# Patient Record
Sex: Female | Born: 1980 | Race: White | Hispanic: No | Marital: Married | Smoking: Never smoker
Health system: Southern US, Community
[De-identification: ages and names within clinical notes are randomized; demographics above are authoritative.]

## PROBLEM LIST (undated history)

## (undated) DIAGNOSIS — G9340 Encephalopathy, unspecified: Secondary | ICD-10-CM

## (undated) DIAGNOSIS — S15009A Unspecified injury of unspecified carotid artery, initial encounter: Secondary | ICD-10-CM

## (undated) DIAGNOSIS — D649 Anemia, unspecified: Secondary | ICD-10-CM

## (undated) DIAGNOSIS — Z8489 Family history of other specified conditions: Secondary | ICD-10-CM

## (undated) DIAGNOSIS — Z8619 Personal history of other infectious and parasitic diseases: Secondary | ICD-10-CM

## (undated) DIAGNOSIS — R87629 Unspecified abnormal cytological findings in specimens from vagina: Secondary | ICD-10-CM

## (undated) DIAGNOSIS — F419 Anxiety disorder, unspecified: Secondary | ICD-10-CM

## (undated) DIAGNOSIS — Z789 Other specified health status: Secondary | ICD-10-CM

## (undated) DIAGNOSIS — S42009A Fracture of unspecified part of unspecified clavicle, initial encounter for closed fracture: Secondary | ICD-10-CM

## (undated) DIAGNOSIS — R569 Unspecified convulsions: Secondary | ICD-10-CM

## (undated) HISTORY — PX: WISDOM TOOTH EXTRACTION: SHX21

## (undated) HISTORY — DX: Personal history of other infectious and parasitic diseases: Z86.19

## (undated) HISTORY — DX: Unspecified abnormal cytological findings in specimens from vagina: R87.629

## (undated) HISTORY — PX: COLPOSCOPY: SHX161

## (undated) HISTORY — PX: CHEST TUBE INSERTION: SHX231

---

## 2002-09-20 ENCOUNTER — Other Ambulatory Visit: Admission: RE | Admit: 2002-09-20 | Discharge: 2002-09-20 | Payer: Self-pay | Admitting: Obstetrics and Gynecology

## 2004-09-11 ENCOUNTER — Other Ambulatory Visit: Admission: RE | Admit: 2004-09-11 | Discharge: 2004-09-11 | Payer: Self-pay | Admitting: Obstetrics and Gynecology

## 2005-06-10 ENCOUNTER — Other Ambulatory Visit: Admission: RE | Admit: 2005-06-10 | Discharge: 2005-06-10 | Payer: Self-pay | Admitting: Obstetrics and Gynecology

## 2006-01-16 ENCOUNTER — Other Ambulatory Visit: Admission: RE | Admit: 2006-01-16 | Discharge: 2006-01-16 | Payer: Self-pay | Admitting: Obstetrics and Gynecology

## 2014-01-20 LAB — OB RESULTS CONSOLE ABO/RH: RH Type: POSITIVE

## 2014-01-20 LAB — OB RESULTS CONSOLE HEPATITIS B SURFACE ANTIGEN: Hepatitis B Surface Ag: NEGATIVE

## 2014-01-20 LAB — OB RESULTS CONSOLE GC/CHLAMYDIA
Chlamydia: NEGATIVE
GC PROBE AMP, GENITAL: NEGATIVE

## 2014-01-20 LAB — OB RESULTS CONSOLE RPR: RPR: NONREACTIVE

## 2014-01-20 LAB — OB RESULTS CONSOLE ANTIBODY SCREEN: ANTIBODY SCREEN: NEGATIVE

## 2014-01-20 LAB — OB RESULTS CONSOLE HIV ANTIBODY (ROUTINE TESTING): HIV: NONREACTIVE

## 2014-01-20 LAB — OB RESULTS CONSOLE RUBELLA ANTIBODY, IGM: Rubella: IMMUNE

## 2014-01-21 ENCOUNTER — Inpatient Hospital Stay (HOSPITAL_COMMUNITY): Admission: AD | Admit: 2014-01-21 | Payer: Self-pay | Source: Ambulatory Visit | Admitting: Obstetrics and Gynecology

## 2014-08-09 ENCOUNTER — Encounter (HOSPITAL_COMMUNITY): Payer: Self-pay | Admitting: *Deleted

## 2014-08-09 ENCOUNTER — Telehealth (HOSPITAL_COMMUNITY): Payer: Self-pay | Admitting: *Deleted

## 2014-08-09 NOTE — Telephone Encounter (Signed)
Preadmission screen  

## 2014-08-10 ENCOUNTER — Telehealth (HOSPITAL_COMMUNITY): Payer: Self-pay | Admitting: *Deleted

## 2014-08-10 ENCOUNTER — Other Ambulatory Visit (HOSPITAL_COMMUNITY): Payer: Self-pay | Admitting: Obstetrics and Gynecology

## 2014-08-10 LAB — OB RESULTS CONSOLE GBS: GBS: NEGATIVE

## 2014-08-10 NOTE — Telephone Encounter (Signed)
Preadmission screen  

## 2014-08-11 ENCOUNTER — Inpatient Hospital Stay (HOSPITAL_COMMUNITY): Payer: PRIVATE HEALTH INSURANCE | Admitting: Anesthesiology

## 2014-08-11 ENCOUNTER — Encounter (HOSPITAL_COMMUNITY): Payer: Self-pay

## 2014-08-11 ENCOUNTER — Inpatient Hospital Stay (HOSPITAL_COMMUNITY)
Admission: RE | Admit: 2014-08-11 | Discharge: 2014-08-13 | DRG: 775 | Disposition: A | Discharge status: Home / Self Care | Payer: PRIVATE HEALTH INSURANCE | Source: Ambulatory Visit | Attending: Obstetrics and Gynecology | Admitting: Obstetrics and Gynecology

## 2014-08-11 DIAGNOSIS — Z3490 Encounter for supervision of normal pregnancy, unspecified, unspecified trimester: Secondary | ICD-10-CM

## 2014-08-11 DIAGNOSIS — Z3A38 38 weeks gestation of pregnancy: Secondary | ICD-10-CM | POA: Diagnosis present

## 2014-08-11 DIAGNOSIS — O365930 Maternal care for other known or suspected poor fetal growth, third trimester, not applicable or unspecified: Principal | ICD-10-CM | POA: Diagnosis present

## 2014-08-11 HISTORY — DX: Other specified health status: Z78.9

## 2014-08-11 LAB — TYPE AND SCREEN
ABO/RH(D): A POS
Antibody Screen: NEGATIVE

## 2014-08-11 LAB — CBC
HEMATOCRIT: 35 % — AB (ref 36.0–46.0)
HEMOGLOBIN: 11.9 g/dL — AB (ref 12.0–15.0)
MCH: 33.5 pg (ref 26.0–34.0)
MCHC: 34 g/dL (ref 30.0–36.0)
MCV: 98.6 fL (ref 78.0–100.0)
Platelets: 223 10*3/uL (ref 150–400)
RBC: 3.55 MIL/uL — AB (ref 3.87–5.11)
RDW: 12.8 % (ref 11.5–15.5)
WBC: 9.7 10*3/uL (ref 4.0–10.5)

## 2014-08-11 LAB — RPR

## 2014-08-11 LAB — ABO/RH: ABO/RH(D): A POS

## 2014-08-11 MED ORDER — DIPHENHYDRAMINE HCL 25 MG PO CAPS: 25.0000 mg | ORAL_CAPSULE | Freq: Four times a day (QID) | ORAL | Status: DC | PRN

## 2014-08-11 MED ORDER — PHENYLEPHRINE 40 MCG/ML (10ML) SYRINGE FOR IV PUSH (FOR BLOOD PRESSURE SUPPORT)
80.0000 ug | PREFILLED_SYRINGE | INTRAVENOUS | Status: DC | PRN
  Filled 2014-08-11: qty 2

## 2014-08-11 MED ORDER — DIPHENHYDRAMINE HCL 50 MG/ML IJ SOLN: 12.5000 mg | INTRAMUSCULAR | Status: DC | PRN

## 2014-08-11 MED ORDER — WITCH HAZEL-GLYCERIN EX PADS: 1.0000 "application " | MEDICATED_PAD | CUTANEOUS | Status: DC | PRN

## 2014-08-11 MED ORDER — OXYCODONE-ACETAMINOPHEN 5-325 MG PO TABS: 2.0000 | ORAL_TABLET | ORAL | Status: DC | PRN

## 2014-08-11 MED ORDER — TETANUS-DIPHTH-ACELL PERTUSSIS 5-2.5-18.5 LF-MCG/0.5 IM SUSP: 0.5000 mL | Freq: Once | INTRAMUSCULAR | Status: DC

## 2014-08-11 MED ORDER — ONDANSETRON HCL 4 MG/2ML IJ SOLN: 4.0000 mg | INTRAMUSCULAR | Status: DC | PRN

## 2014-08-11 MED ORDER — OXYTOCIN 40 UNITS IN LACTATED RINGERS INFUSION - SIMPLE MED
62.5000 mL/h | INTRAVENOUS | Status: DC
Start: 2014-08-11 — End: 2014-08-13
  Filled 2014-08-11: qty 1000

## 2014-08-11 MED ORDER — IBUPROFEN 600 MG PO TABS
600.0000 mg | ORAL_TABLET | Freq: Four times a day (QID) | ORAL | Status: DC
  Administered 2014-08-11 – 2014-08-13 (×8): 600 mg via ORAL
  Filled 2014-08-11 (×7): qty 1

## 2014-08-11 MED ORDER — LIDOCAINE HCL (PF) 1 % IJ SOLN
INTRAMUSCULAR | Status: DC | PRN
  Administered 2014-08-11 (×4): 4 mL

## 2014-08-11 MED ORDER — FENTANYL 2.5 MCG/ML BUPIVACAINE 1/10 % EPIDURAL INFUSION (WH - ANES)
14.0000 mL/h | INTRAMUSCULAR | Status: DC | PRN
  Administered 2014-08-11: 14 mL/h via EPIDURAL
  Filled 2014-08-11: qty 125

## 2014-08-11 MED ORDER — PHENYLEPHRINE 40 MCG/ML (10ML) SYRINGE FOR IV PUSH (FOR BLOOD PRESSURE SUPPORT)
80.0000 ug | PREFILLED_SYRINGE | INTRAVENOUS | Status: DC | PRN
  Filled 2014-08-11: qty 10
  Filled 2014-08-11: qty 2

## 2014-08-11 MED ORDER — EPHEDRINE 5 MG/ML INJ
10.0000 mg | INTRAVENOUS | Status: DC | PRN
  Filled 2014-08-11: qty 2

## 2014-08-11 MED ORDER — BENZOCAINE-MENTHOL 20-0.5 % EX AERO: 1.0000 "application " | INHALATION_SPRAY | CUTANEOUS | Status: DC | PRN

## 2014-08-11 MED ORDER — FENTANYL 2.5 MCG/ML BUPIVACAINE 1/10 % EPIDURAL INFUSION (WH - ANES)
INTRAMUSCULAR | Status: DC | PRN
  Administered 2014-08-11: 14 mL/h via EPIDURAL

## 2014-08-11 MED ORDER — EPHEDRINE 5 MG/ML INJ
10.0000 mg | INTRAVENOUS | Status: DC | PRN
Start: 2014-08-11 — End: 2014-08-11
  Filled 2014-08-11: qty 2

## 2014-08-11 MED ORDER — OXYTOCIN BOLUS FROM INFUSION
500.0000 mL | INTRAVENOUS | Status: DC
Start: 2014-08-11 — End: 2014-08-13
  Administered 2014-08-11: 500 mL via INTRAVENOUS

## 2014-08-11 MED ORDER — OXYCODONE-ACETAMINOPHEN 5-325 MG PO TABS: 1.0000 | ORAL_TABLET | ORAL | Status: DC | PRN

## 2014-08-11 MED ORDER — SENNOSIDES-DOCUSATE SODIUM 8.6-50 MG PO TABS
2.0000 | ORAL_TABLET | ORAL | Status: DC
  Administered 2014-08-11 – 2014-08-12 (×2): 2 via ORAL
  Filled 2014-08-11 (×2): qty 2

## 2014-08-11 MED ORDER — LACTATED RINGERS IV SOLN: 500.0000 mL | INTRAVENOUS | Status: DC | PRN

## 2014-08-11 MED ORDER — LACTATED RINGERS IV SOLN: INTRAVENOUS | Status: AC

## 2014-08-11 MED ORDER — SIMETHICONE 80 MG PO CHEW: 80.0000 mg | CHEWABLE_TABLET | ORAL | Status: DC | PRN

## 2014-08-11 MED ORDER — OXYTOCIN 40 UNITS IN LACTATED RINGERS INFUSION - SIMPLE MED
1.0000 m[IU]/min | INTRAVENOUS | Status: DC
  Administered 2014-08-11: 2 m[IU]/min via INTRAVENOUS
  Filled 2014-08-11: qty 1000

## 2014-08-11 MED ORDER — LACTATED RINGERS IV SOLN: 500.0000 mL | Freq: Once | INTRAVENOUS | Status: DC

## 2014-08-11 MED ORDER — LACTATED RINGERS IV SOLN
INTRAVENOUS | Status: DC
  Administered 2014-08-11: 08:00:00 via INTRAVENOUS

## 2014-08-11 MED ORDER — MEASLES, MUMPS & RUBELLA VAC ~~LOC~~ INJ
0.5000 mL | INJECTION | Freq: Once | SUBCUTANEOUS | Status: DC
Start: 2014-08-12 — End: 2014-08-13

## 2014-08-11 MED ORDER — ZOLPIDEM TARTRATE 5 MG PO TABS: 5.0000 mg | ORAL_TABLET | Freq: Every evening | ORAL | Status: DC | PRN

## 2014-08-11 MED ORDER — PRENATAL MULTIVITAMIN CH
1.0000 | ORAL_TABLET | Freq: Every day | ORAL | Status: DC
  Filled 2014-08-11: qty 1

## 2014-08-11 MED ORDER — PRENATAL MULTIVITAMIN CH: 1.0000 | ORAL_TABLET | Freq: Every day | ORAL | Status: DC

## 2014-08-11 MED ORDER — DIBUCAINE 1 % RE OINT: 1.0000 "application " | TOPICAL_OINTMENT | RECTAL | Status: DC | PRN

## 2014-08-11 MED ORDER — LANOLIN HYDROUS EX OINT
TOPICAL_OINTMENT | CUTANEOUS | Status: DC | PRN
Start: 2014-08-11 — End: 2014-08-13

## 2014-08-11 MED ORDER — LIDOCAINE HCL (PF) 1 % IJ SOLN
30.0000 mL | INTRAMUSCULAR | Status: AC | PRN
  Administered 2014-08-11: 30 mL via SUBCUTANEOUS
  Filled 2014-08-11: qty 30

## 2014-08-11 MED ORDER — OXYTOCIN 40 UNITS IN LACTATED RINGERS INFUSION - SIMPLE MED: 62.5000 mL/h | INTRAVENOUS | Status: DC

## 2014-08-11 MED ORDER — ONDANSETRON HCL 4 MG PO TABS: 4.0000 mg | ORAL_TABLET | ORAL | Status: DC | PRN

## 2014-08-11 NOTE — H&P (Signed)
NAMElesa [name]:  [name], [name]                ACCOUNT NO.:  [id number]  MEDICAL RECORD NO.:  [id number]  LOCATION:                                 FACILITY:  PHYSICIAN:  [name] [name], M.D. DATE OF BIRTH:  08-15-1981  DATE OF ADMISSION:  08/11/2014 DATE OF DISCHARGE:                             HISTORY & PHYSICAL   PRESENT ILLNESS:  This is a 33 year old white female, para 0, gravida 1, with EDC on August 25, 2014, admitted for induction of labor at 38 weeks and 0 days because of suspected intrauterine growth restriction. Blood group and type is A positive.  Negative antibody.  RPR negative. Urine culture negative.  Hepatitis B surface antigen negative.  HIV negative.  GC and Chlamydia negative.  Varicella immune.  Rubella immune.  Hemoglobin electrophoresis AA.  Pap test normal.  First trimester screen negative.  One-hour Glucola 118.  Repeat HIV and RPR negative.  Group B strep negative.  This patient began her prenatal course at [redacted] weeks gestation, at that time, her weight was 124 pounds. Her initial blood pressure was 88/52.  At her 18-week ultrasound, there was a possibility of succenturiate lobe of the placenta and also the baby's calyceal system seemed to be slightly dilated.  At 22 weeks, she began to have nausea.  She tried Phenergan and apparently it gave her some relief.  She was noted to have a short cervix at 27 weeks and was advised to reduce her work hours, decrease activities, use pelvic rest, start vaginal progesterone.  On her ultrasound, the accessory lobe of the placenta was gone.  The estimated fetal weight was in the 42nd percentile.  Cervical length varied from 14-55 mm.  There was no funneling.  Kidneys looked normal.  Subsequently, she used Prometrium daily.  At 29 weeks, her fundal height was 27 cm.  At 30 weeks, she continued to measure a little low, so ultrasound was done to check for the baby's growth.  At 32 weeks, her cervical length was 2.3 cm.  The AFI and  growth were normal.  At 36 weeks, the fundal height was 34 cm and the ultrasound was ordered for growth.  Ultrasound was done suggesting a small baby.  I spoke to Dr. Harlon FlorWhitaker, he felt like we should treat the baby as an intrauterine growth restriction.  Requested 2 times per week nonstress test, weekly Dopplers, and AFI.  Nonstress tests continued to be reactive.  The ultrasound was normal Dopplers.  On August 07, 2014, I reviewed with Dr. Harlon FlorWhitaker the plan to deliver at 38- 39 weeks and he agreed.  On her exam, August 10, 2014, the cervix was 3 cm, 80% effaced.  Her weight gain for the entire pregnancy had been only 13 pounds.  She is admitted for induction of labor because of the suspected growth restriction.  Her ultrasound on July 31, 2014, placed the baby at 11.8 percentile.  The AFI was normal.  The femur length and abdominal circumference were quite low.  The abdominal circumference in the 4.4 percentile, femur length less than 2.3rd percentile.  Biophysical profile was 8 out of 8.  PAST MEDICAL HISTORY:  Reveals usual childhood diseases.  She did have colposcopy in 2013, nothing other than that.  ALLERGIES:  No known drug allergies.  No latex or food allergies.  SOCIAL HISTORY:  The patient never smoked.  Does not drink.  Denies illegal drugs.  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  Her husband is a Development worker, international aidgeneral surgeon at [employer].  FAMILY HISTORY:  Her mother had cancer of the breast and cancer of the uterus, father had lymphoma, sister had lymphoma, and some relative had a cancer of the cervix.  PHYSICAL EXAMINATION:  GENERAL:  Her exam done on August 10, 2014, showed a well-developed, slender white female. VITAL SIGNS:  Weight 137 pounds.  Blood pressure 98/60 and pulse of 70. HEART:  Normal size and sounds.  No murmurs. LUNGS:  Clear to auscultation. PELVIC:  Fundal height 33  cm.  Fetal heart tones normal.  Cervix 3 cm, 80%, vertex at -2.  ADMITTING IMPRESSION:  Intrauterine pregnancy at 38 weeks.  Probable intrauterine growth restriction.  The patient is admitted for induction of labor.     [name] [name], M.D.     TFH/MEDQ  D:  08/10/2014  T:  08/10/2014  Job:  [id number]

## 2014-08-11 NOTE — Plan of Care (Signed)
Problem: Phase I Progression Outcomes Goal: Pain controlled with appropriate interventions Outcome: Completed/Met Date Met:  08/11/14 Goal: Voiding adequately Outcome: Completed/Met Date Met:  08/11/14 Goal: OOB as tolerated unless otherwise ordered Outcome: Completed/Met Date Met:  08/11/14 Goal: Initial discharge plan identified Outcome: Completed/Met Date Met:  08/11/14

## 2014-08-11 NOTE — Plan of Care (Signed)
Problem: Phase I Progression Outcomes Goal: VS, stable, temp < 100.4 degrees F Outcome: Completed/Met Date Met:  08/11/14 Goal: Other Phase I Outcomes/Goals Outcome: Completed/Met Date Met:  08/11/14  Problem: Phase II Progression Outcomes Goal: Pain controlled on oral analgesia Outcome: Completed/Met Date Met:  08/11/14 Goal: Progress activity as tolerated unless otherwise ordered Outcome: Completed/Met Date Met:  08/11/14 Goal: Afebrile, VS remain stable Outcome: Completed/Met Date Met:  08/11/14 Goal: Tolerating diet Outcome: Completed/Met Date Met:  08/11/14 Goal: Other Phase II Outcomes/Goals Outcome: Completed/Met Date Met:  08/11/14

## 2014-08-11 NOTE — Progress Notes (Signed)
Patient ID: [name] [name], female   DOB: [birth year], 33 y.o.   MRN: [id number] Pt admitted at 38 weeks for induction because of suspected IUGR. The cervix is 3 cm 80 % effaced and the vertex is at - 2 station.AROM produced clear fluid.

## 2014-08-11 NOTE — Anesthesia Procedure Notes (Signed)
Epidural Patient location during procedure: OB Start time: 08/11/2014 10:49 AM  Staffing Anesthesiologist: Damyan Corne Performed by: anesthesiologist   Preanesthetic Checklist Completed: patient identified, site marked, surgical consent, pre-op evaluation, timeout performed, IV checked, risks and benefits discussed and monitors and equipment checked  Epidural Patient position: sitting Prep: site prepped and draped and DuraPrep Patient monitoring: continuous pulse ox and blood pressure Approach: midline Location: L3-L4 Injection technique: LOR air  Needle:  Needle type: Tuohy  Needle gauge: 17 G Needle length: 9 cm and 9 Needle insertion depth: 5 cm cm Catheter type: closed end flexible Catheter size: 19 Gauge Catheter at skin depth: 10 cm Test dose: negative  Assessment Events: blood not aspirated, injection not painful, no injection resistance, negative IV test and no paresthesia  Additional Notes Discussed risk of headache, infection, bleeding, nerve injury and failed or incomplete block.  Patient voices understanding and wishes to proceed.  Epidural placed easily on first attempt.  No paresthesia.  Patient tolerated procedure well with no apparent complications.  Jasmine DecemberA. Marialena Wollen, MDReason for block:procedure for pain

## 2014-08-11 NOTE — Progress Notes (Signed)
Patient ID: [name] [name], female   DOB: [birth year], 33 y.o.   MRN: [id number] Delivery note:  The pt reached full dilatation and pushed well. She delivered a living female infant spontaneously LOA over a small left of ML perineal laceration The vertex was rotated to ROT to facilitate delivery. The infant had Apgars of 8 and 9 at 1 and 5 minutes. The O2 sats were not quite normal so the nursery nurse and Dr. Joana Reameravanzo evaluated the baby and felt the baby should be taken to the nursery for O2 by hood. The placenta delivered intact and the uterus was normal.There was a left labial laceration that was bleeding and was repaired under local block and the perineal laceration was also repaired 3-0 vicryl was used. EBL 300 cc's.

## 2014-08-11 NOTE — Lactation Note (Signed)
This note was copied from the chart of Boy Jackqulyn LivingsCasey Hollomon. Lactation Consultation Note  Patient Name: Boy Jackqulyn LivingsCasey Mcneal AVWUJ'WToday's Date: 08/11/2014 Reason for consult: Initial assessment Baby 9 hours of life, [facility]. Mom and FOB attempting to use DEBP when LC entered room. Refitted mom with #27 flanges, mom reports increased comfort. Reviewed use and cleaning of DEBP. Mom getting good amount of colostrum. Obtained baby EBM labels from NICU. Mom given small containers with attached lids for collected colostrum/EBM for baby. Enc mom to pump one more time tonight, then she could go for 4-5 hours to sleep, starting back in the morning to pump every 3 hours for 15 minutes. Assisted mom to label colostrum and mom and FOB took up to NICU for baby. Mom given NICU booklet, and LC brochure. Mom anxious to get colostrum upstairs to baby, so placed everything in room for parents use. Enc mom to call out for assistance as needed. Discussed LC assessment and interventions with patient's MBU RN Shanda BumpsJessica.  Maternal Data Does the patient have breastfeeding experience prior to this delivery?: No  Feeding    LATCH Score/Interventions                      Lactation Tools Discussed/Used Pump Review: Setup, frequency, and cleaning;Milk Storage Initiated by:: JW Date initiated:: 08/11/14   Consult Status Consult Status: Follow-up Date: 08/12/14 Follow-up type: In-patient    Geralynn OchsWILLIARD, Glora Hulgan 08/11/2014, 11:21 PM

## 2014-08-11 NOTE — Progress Notes (Signed)
Patient ID: [name] [name], female   DOB: April 18, 1981, 33 y.o.   MRN: [id number] Contractions are q 2 minutes. The cervix is 9+ cm with a small anterior lip and the vertex is at + 2 station. The FHR is normal

## 2014-08-11 NOTE — Anesthesia Preprocedure Evaluation (Signed)

## 2014-08-12 LAB — CBC
HEMATOCRIT: 36.1 % (ref 36.0–46.0)
Hemoglobin: 12.6 g/dL (ref 12.0–15.0)
MCH: 34.4 pg — ABNORMAL HIGH (ref 26.0–34.0)
MCHC: 34.9 g/dL (ref 30.0–36.0)
MCV: 98.6 fL (ref 78.0–100.0)
Platelets: 220 10*3/uL (ref 150–400)
RBC: 3.66 MIL/uL — AB (ref 3.87–5.11)
RDW: 12.8 % (ref 11.5–15.5)
WBC: 13 10*3/uL — AB (ref 4.0–10.5)

## 2014-08-12 NOTE — Plan of Care (Signed)
Problem: Discharge Progression Outcomes Goal: Tolerating diet Outcome: Completed/Met Date Met:  08/12/14     

## 2014-08-12 NOTE — Anesthesia Postprocedure Evaluation (Signed)
Anesthesia Post Note  Patient: [name] [name]  Procedure(s) Performed: * No procedures listed *  Anesthesia type: Epidural  Patient location: Mother/Baby  Post pain: Pain level controlled  Post assessment: Post-op Vital signs reviewed  Last Vitals:  Filed Vitals:   08/12/14 0538  BP: 100/71  Pulse: 49  Temp: 36.7 C  Resp: 18    Post vital signs: Reviewed  Level of consciousness:alert  Complications: No apparent anesthesia complications

## 2014-08-12 NOTE — Progress Notes (Signed)
Patient ID: [name] MerlCasey H [name], female   DOB: 1981-03-13, 33 y.o.   MRN: [id number] #1 AFEBRILE BP NORMAL NO PROBLEMS

## 2014-08-12 NOTE — Lactation Note (Signed)
This note was copied from the chart of [name] Jackqulyn LivingsCasey Laverdiere. Lactation Consultation Note  Baby latched in cross cradle position upon entering the room. Mother massaging breasts as he breastfeeds. Mother states she is concerned about how much volume he is getting wants to give him some good stuff. Explained that the "good stuff" is breastmilk and praised her for breastfeeding. Described the many benefits of breastfeeding.  Mother has been pumping and getting approx.  1 cc. Mother asked for formula.  Explained LEAD.  Provided her with volume guidelines. Described size of baby's stomach and supply and demand. Suggest she breastfeed first then supplement if she chooses. Reviewed that is she is concerned about her milk supply, she should post pump 4-6 times a day for 15-20 min and give baby back volume pumped.  Mother is anxious.  Patient Name: [name] Jackqulyn LivingsCasey Kinslow WUJWJ'XToday's Date: 08/12/2014 Reason for consult: Follow-up assessment   Maternal Data    Feeding    LATCH Score/Interventions Latch: Grasps breast easily, tongue down, lips flanged, rhythmical sucking. Intervention(s): Breast massage  Audible Swallowing: A few with stimulation  Type of Nipple: Everted at rest and after stimulation  Comfort (Breast/Nipple): Soft / non-tender     Hold (Positioning): Assistance needed to correctly position infant at breast and maintain latch.  LATCH Score: 8  Lactation Tools Discussed/Used     Consult Status Consult Status: Follow-up Date: 08/13/14 Follow-up type: In-patient    [name] [name], [name] [facility] 08/12/2014, 2:29 PM

## 2014-08-13 MED ORDER — IBUPROFEN 600 MG PO TABS: 600.0000 mg | ORAL_TABLET | Freq: Four times a day (QID) | ORAL | Status: DC | PRN

## 2014-08-13 NOTE — Discharge Summary (Signed)
NAMElesa Hacker:  [name], [name] [name]                ACCOUNT NO.:  [id number]  MEDICAL RECORD NO.:  [id number]  LOCATION:  9141                          FACILITY:  [facility]  PHYSICIAN:  Malachi Prohomas F. Ambrose MantleHenley, M.D. DATE OF BIRTH:  1981-04-12  DATE OF ADMISSION:  08/11/2014 DATE OF DISCHARGE:  08/13/2014                              DISCHARGE SUMMARY   HOSPITAL COURSE:  This is a 33 year old white female, para 0, gravida 1, admitted for induction of labor at 38 weeks and 0 day because of suspected intrauterine growth restriction.  The patient was admitted and placed on Pitocin.  She went into labor, received an epidural, progressed to full dilatation, and delivered a 6-pound 8-ounce female infant spontaneously LOA over __________ midline perineal laceration. Apgars were 8 and 9, but O2 sats were not quite normal, so Dr. Joana ReameraVanzo from the NICU evaluated the baby, felt the baby should be observed in the regular nursery, it was, but O2 sats still remained a little low, so was transferred to the NICU where the baby was normal on admission, stayed there a day, came back to the room, underwent a circumcision and was ready for discharge on the second postpartum day, as was the patient.  The patient's vital signs have remained normal.  Her initial hemoglobin was 11.9, hematocrit 35, platelet count 223,000, white count 9700, and followup hemoglobin 12.6.  FINAL DIAGNOSES:  Intrauterine pregnancy at 38 weeks, delivered left occiput anterior, suspected intrauterine growth restriction.  OPERATION:  Spontaneous delivery LOA, repair of a small laceration on the left labium and on the perineum.  FINAL CONDITION:  Improved.  INSTRUCTIONS:  Include our regular discharge instruction booklet as well as an after visit summary, prescription for Motrin 600 mg 30 tablets, 1 every 6 hours as needed for pain.  She is advised to return to the office in 6 weeks for followup examination.     Malachi Prohomas F. Ambrose MantleHenley, M.D.     TFH/MEDQ   D:  08/13/2014  T:  08/13/2014  Job:  [id number]

## 2014-08-13 NOTE — Lactation Note (Addendum)
This note was copied from the chart of [name] Jackqulyn LivingsCasey Jinkins. Lactation Consultation Note  P1, Mother put baby to the breast while sitting on the edge of the bed. Mom latching baby in cradle hold and not obtaining good depth with latch LC assisted Mom with latching baby for more depth with latch.  Baby sleepy at the breast.  Suggest mother undress baby and massage her breast during feeding. Baby  BF for 10 minutes then fell asleep.  Stressed to parents to be sure baby is nursing both breasts each feeding as much as possible to obtain more volume with feedings.   Try to keep baby actively nursing for 15-20 minutes both breasts each feeding. Advised baby should be at the breast whenever hungry.    Demonstrated how to achieve a deeper latch and how to Dollar Generalunlatch. Reviewed cluster feeding, engorgement care and supply and demand. Mother's nipples are pink.  Provided comfort gels and applying ebm. Encouraged longer feedings before giving supplement to establish her milk supply.  Patient Name: [name] [name] ZOXWR'UToday's Date: 08/13/2014 Reason for consult: Follow-up assessment   Maternal Data    Feeding Feeding Type: Breast Fed Length of feed: 10 min  LATCH Score/Interventions Latch: Grasps breast easily, tongue down, lips flanged, rhythmical sucking. Intervention(s): Breast massage;Assist with latch;Adjust position  Audible Swallowing: A few with stimulation Intervention(s): Alternate breast massage  Type of Nipple: Everted at rest and after stimulation  Comfort (Breast/Nipple): Filling, red/small blisters or bruises, mild/mod discomfort  Problem noted: Mild/Moderate discomfort Interventions (Mild/moderate discomfort): Comfort gels;Hand expression  Hold (Positioning): Assistance needed to correctly position infant at breast and maintain latch. Intervention(s): Support Pillows  LATCH Score: 6  Lactation Tools Discussed/Used Tools: Comfort gels   Consult Status Consult Status:  Complete    Hardie PulleyBerkelhammer, Ruth Boschen 08/13/2014, 9:04 AM

## 2014-08-13 NOTE — Progress Notes (Signed)
Patient ID: [name] [name], female   DOB: 10/05/1980, 33 y.o.   MRN: [id number] #2 afebrile BP normal no problems for d/c

## 2014-08-13 NOTE — Discharge Instructions (Signed)
booklet °

## 2014-08-13 NOTE — Plan of Care (Signed)
Problem: Discharge Progression Outcomes Goal: Barriers To Progression Addressed/Resolved Outcome: Completed/Met Date Met:  08/13/14 Goal: Activity appropriate for discharge plan Outcome: Completed/Met Date Met:  43/56/86 Goal: Complications resolved/controlled Outcome: Completed/Met Date Met:  08/13/14 Goal: Pain controlled with appropriate interventions Outcome: Completed/Met Date Met:  08/13/14 Goal: Afebrile, VS remain stable at discharge Outcome: Completed/Met Date Met:  08/13/14 Goal: Discharge plan in place and appropriate Outcome: Completed/Met Date Met:  08/13/14

## 2014-08-14 ENCOUNTER — Ambulatory Visit: Payer: Self-pay

## 2014-08-14 NOTE — Lactation Note (Signed)
This note was copied from the chart of [name] Jackqulyn LivingsCasey [name]. Lactation Consultation Note  Patient Name: [name] [name] ZOXWR'UToday's Date: 08/14/2014 Reason for consult: Follow-up assessment  Baby is 2169 hours old , and being discharged off photo tx . Mom and dad are ready to be discharged  And dad is in the process of feeding #EBM form a bottle mom pumped off . Per mom milk is in and recently pumped off  Almost 30 ml. LC stressed the importance of baby receiving EBM if available to to cleaning out the gut quicker therefore the jaundice  Would improve quicker. Per mom has been breast feeding and pumping. And plans to do both. LC reviewed sore nipple and engorgement prevention and tx. Mother informed of post-discharge support and given phone number to the lactation department, including services for phone call assistance; out-patient appointments; and breastfeeding support group. List of other breastfeeding resources in the community given in the handout. Encouraged  mother to call for problems or concerns related to breastfeeding.   Maternal Data    Feeding Feeding Type: Breast Milk Nipple Type: Slow - flow Length of feed: 10 min  LATCH Score/Interventions                Intervention(s): Breastfeeding basics reviewed     Lactation Tools Discussed/Used     Consult Status Consult Status: Complete Date: 08/14/14    [name], [name] [name] 08/14/2014, 9:59 AM

## 2016-07-15 ENCOUNTER — Emergency Department (HOSPITAL_COMMUNITY): Payer: PRIVATE HEALTH INSURANCE

## 2016-07-15 ENCOUNTER — Inpatient Hospital Stay (HOSPITAL_COMMUNITY)
Admission: EM | Admit: 2016-07-15 | Discharge: 2016-07-19 | DRG: 963 | Disposition: A | Discharge status: Home / Self Care | Payer: PRIVATE HEALTH INSURANCE | Attending: Surgery | Admitting: Surgery

## 2016-07-15 ENCOUNTER — Encounter (HOSPITAL_COMMUNITY): Payer: Self-pay | Admitting: Emergency Medicine

## 2016-07-15 DIAGNOSIS — Y92410 Unspecified street and highway as the place of occurrence of the external cause: Secondary | ICD-10-CM

## 2016-07-15 DIAGNOSIS — T1490XA Injury, unspecified, initial encounter: Secondary | ICD-10-CM | POA: Diagnosis not present

## 2016-07-15 DIAGNOSIS — S42022A Displaced fracture of shaft of left clavicle, initial encounter for closed fracture: Secondary | ICD-10-CM

## 2016-07-15 DIAGNOSIS — S06820A Injury of left internal carotid artery, intracranial portion, not elsewhere classified without loss of consciousness, initial encounter: Secondary | ICD-10-CM | POA: Diagnosis present

## 2016-07-15 DIAGNOSIS — J939 Pneumothorax, unspecified: Secondary | ICD-10-CM

## 2016-07-15 DIAGNOSIS — S270XXA Traumatic pneumothorax, initial encounter: Secondary | ICD-10-CM

## 2016-07-15 DIAGNOSIS — S15192A Other specified injury of left vertebral artery, initial encounter: Secondary | ICD-10-CM | POA: Diagnosis present

## 2016-07-15 DIAGNOSIS — I7774 Dissection of vertebral artery: Secondary | ICD-10-CM | POA: Diagnosis present

## 2016-07-15 DIAGNOSIS — S20219A Contusion of unspecified front wall of thorax, initial encounter: Secondary | ICD-10-CM | POA: Diagnosis present

## 2016-07-15 DIAGNOSIS — S2232XA Fracture of one rib, left side, initial encounter for closed fracture: Secondary | ICD-10-CM

## 2016-07-15 DIAGNOSIS — R0902 Hypoxemia: Secondary | ICD-10-CM | POA: Diagnosis present

## 2016-07-15 DIAGNOSIS — S2220XA Unspecified fracture of sternum, initial encounter for closed fracture: Secondary | ICD-10-CM | POA: Diagnosis present

## 2016-07-15 DIAGNOSIS — R740 Nonspecific elevation of levels of transaminase and lactic acid dehydrogenase [LDH]: Secondary | ICD-10-CM | POA: Diagnosis present

## 2016-07-15 DIAGNOSIS — M79644 Pain in right finger(s): Secondary | ICD-10-CM

## 2016-07-15 LAB — I-STAT CHEM 8, ED
BUN: 4 mg/dL — ABNORMAL LOW (ref 6–20)
CALCIUM ION: 0.98 mmol/L — AB (ref 1.15–1.40)
CHLORIDE: 103 mmol/L (ref 101–111)
Creatinine, Ser: 0.9 mg/dL (ref 0.44–1.00)
Glucose, Bld: 115 mg/dL — ABNORMAL HIGH (ref 65–99)
HCT: 40 % (ref 36.0–46.0)
HEMOGLOBIN: 13.6 g/dL (ref 12.0–15.0)
Potassium: 4.8 mmol/L (ref 3.5–5.1)
SODIUM: 141 mmol/L (ref 135–145)
TCO2: 28 mmol/L (ref 0–100)

## 2016-07-15 LAB — COMPREHENSIVE METABOLIC PANEL
ALBUMIN: 4.4 g/dL (ref 3.5–5.0)
ALK PHOS: 87 U/L (ref 38–126)
ALT: 191 U/L — ABNORMAL HIGH (ref 14–54)
ANION GAP: 11 (ref 5–15)
AST: 400 U/L — ABNORMAL HIGH (ref 15–41)
BUN: <5 mg/dL — ABNORMAL LOW (ref 6–20)
CHLORIDE: 103 mmol/L (ref 101–111)
CO2: 26 mmol/L (ref 22–32)
Calcium: 9.2 mg/dL (ref 8.9–10.3)
Creatinine, Ser: 0.55 mg/dL (ref 0.44–1.00)
GFR calc Af Amer: >60 mL/min (ref >60)
GFR calc non Af Amer: >60 mL/min (ref >60)
GLUCOSE: 116 mg/dL — AB (ref 65–99)
POTASSIUM: 4.8 mmol/L (ref 3.5–5.1)
SODIUM: 140 mmol/L (ref 135–145)
Total Bilirubin: 0.5 mg/dL (ref 0.3–1.2)
Total Protein: 7.3 g/dL (ref 6.5–8.1)

## 2016-07-15 LAB — CBC
HEMATOCRIT: 38 % (ref 36.0–46.0)
HEMOGLOBIN: 13.3 g/dL (ref 12.0–15.0)
MCH: 34.9 pg — AB (ref 26.0–34.0)
MCHC: 35 g/dL (ref 30.0–36.0)
MCV: 99.7 fL (ref 78.0–100.0)
Platelets: 189 10*3/uL (ref 150–400)
RBC: 3.81 MIL/uL — AB (ref 3.87–5.11)
RDW: 12.6 % (ref 11.5–15.5)
WBC: 7 10*3/uL (ref 4.0–10.5)

## 2016-07-15 LAB — PROTIME-INR
INR: 1.01
Prothrombin Time: 13.3 seconds (ref 11.4–15.2)

## 2016-07-15 LAB — CDS SEROLOGY

## 2016-07-15 LAB — I-STAT CG4 LACTIC ACID, ED: Lactic Acid, Venous: 1.96 mmol/L (ref 0.5–1.9)

## 2016-07-15 LAB — I-STAT BETA HCG BLOOD, ED (MC, WL, AP ONLY)

## 2016-07-15 MED ORDER — TETANUS-DIPHTH-ACELL PERTUSSIS 5-2.5-18.5 LF-MCG/0.5 IM SUSP
0.5000 mL | Freq: Once | INTRAMUSCULAR | Status: DC
  Filled 2016-07-15: qty 0.5

## 2016-07-15 MED ORDER — SODIUM CHLORIDE 0.9 % IV BOLUS (SEPSIS)
1000.0000 mL | Freq: Once | INTRAVENOUS | Status: AC
  Administered 2016-07-15: 1000 mL via INTRAVENOUS

## 2016-07-15 MED ORDER — FENTANYL CITRATE (PF) 100 MCG/2ML IJ SOLN
50.0000 ug | INTRAMUSCULAR | Status: DC | PRN
  Administered 2016-07-15: 50 ug via INTRAVENOUS
  Filled 2016-07-15 (×2): qty 2

## 2016-07-15 MED ORDER — ONDANSETRON HCL 4 MG/2ML IJ SOLN
4.0000 mg | Freq: Three times a day (TID) | INTRAMUSCULAR | Status: DC | PRN
  Administered 2016-07-15: 4 mg via INTRAVENOUS
  Filled 2016-07-15 (×2): qty 2

## 2016-07-15 MED ORDER — IOPAMIDOL (ISOVUE-300) INJECTION 61%
INTRAVENOUS | Status: DC
Start: 2016-07-15 — End: 2016-07-15
  Filled 2016-07-15: qty 100

## 2016-07-15 MED ORDER — IOPAMIDOL (ISOVUE-370) INJECTION 76%
INTRAVENOUS | Status: AC
  Administered 2016-07-15: 100 mL
  Filled 2016-07-15: qty 100

## 2016-07-15 MED ORDER — HYDROMORPHONE HCL 2 MG/ML IJ SOLN
0.5000 mg | Freq: Once | INTRAMUSCULAR | Status: AC
  Administered 2016-07-15: 0.5 mg via INTRAVENOUS
  Filled 2016-07-15: qty 1

## 2016-07-15 MED ORDER — SODIUM CHLORIDE 0.9 % IV BOLUS (SEPSIS)
1000.0000 mL | Freq: Once | INTRAVENOUS | Status: AC
  Administered 2016-07-16: 1000 mL via INTRAVENOUS

## 2016-07-15 NOTE — ED Notes (Signed)
Pt returned from radiology at this time via ED stretcher. Pt in no apparent distress at this time.   
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
## 2016-07-16 ENCOUNTER — Inpatient Hospital Stay (HOSPITAL_COMMUNITY): Payer: PRIVATE HEALTH INSURANCE

## 2016-07-16 ENCOUNTER — Other Ambulatory Visit: Payer: Self-pay | Admitting: *Deleted

## 2016-07-16 DIAGNOSIS — S2232XA Fracture of one rib, left side, initial encounter for closed fracture: Secondary | ICD-10-CM | POA: Diagnosis present

## 2016-07-16 DIAGNOSIS — J939 Pneumothorax, unspecified: Secondary | ICD-10-CM | POA: Diagnosis present

## 2016-07-16 DIAGNOSIS — R0902 Hypoxemia: Secondary | ICD-10-CM | POA: Diagnosis present

## 2016-07-16 DIAGNOSIS — I7774 Dissection of vertebral artery: Secondary | ICD-10-CM | POA: Diagnosis not present

## 2016-07-16 DIAGNOSIS — I7771 Dissection of carotid artery: Secondary | ICD-10-CM

## 2016-07-16 DIAGNOSIS — T1490XA Injury, unspecified, initial encounter: Secondary | ICD-10-CM | POA: Diagnosis present

## 2016-07-16 DIAGNOSIS — S20219A Contusion of unspecified front wall of thorax, initial encounter: Secondary | ICD-10-CM | POA: Diagnosis present

## 2016-07-16 DIAGNOSIS — S06820A Injury of left internal carotid artery, intracranial portion, not elsewhere classified without loss of consciousness, initial encounter: Secondary | ICD-10-CM | POA: Diagnosis present

## 2016-07-16 DIAGNOSIS — S42022A Displaced fracture of shaft of left clavicle, initial encounter for closed fracture: Secondary | ICD-10-CM | POA: Diagnosis present

## 2016-07-16 DIAGNOSIS — S270XXA Traumatic pneumothorax, initial encounter: Secondary | ICD-10-CM | POA: Diagnosis present

## 2016-07-16 DIAGNOSIS — S15192A Other specified injury of left vertebral artery, initial encounter: Secondary | ICD-10-CM | POA: Diagnosis present

## 2016-07-16 DIAGNOSIS — Y92410 Unspecified street and highway as the place of occurrence of the external cause: Secondary | ICD-10-CM | POA: Diagnosis not present

## 2016-07-16 DIAGNOSIS — R740 Nonspecific elevation of levels of transaminase and lactic acid dehydrogenase [LDH]: Secondary | ICD-10-CM | POA: Diagnosis present

## 2016-07-16 DIAGNOSIS — S2220XA Unspecified fracture of sternum, initial encounter for closed fracture: Secondary | ICD-10-CM | POA: Diagnosis present

## 2016-07-16 LAB — BASIC METABOLIC PANEL
Anion gap: 10 (ref 5–15)
CALCIUM: 8.4 mg/dL — AB (ref 8.9–10.3)
CHLORIDE: 100 mmol/L — AB (ref 101–111)
CO2: 26 mmol/L (ref 22–32)
CREATININE: 0.54 mg/dL (ref 0.44–1.00)
GFR calc Af Amer: >60 mL/min (ref >60)
GFR calc non Af Amer: >60 mL/min (ref >60)
Glucose, Bld: 119 mg/dL — ABNORMAL HIGH (ref 65–99)
Potassium: 3.9 mmol/L (ref 3.5–5.1)
SODIUM: 136 mmol/L (ref 135–145)

## 2016-07-16 LAB — CBC
HCT: 33.1 % — ABNORMAL LOW (ref 36.0–46.0)
HEMATOCRIT: 33.1 % — AB (ref 36.0–46.0)
HEMOGLOBIN: 11.3 g/dL — AB (ref 12.0–15.0)
HEMOGLOBIN: 11.2 g/dL — AB (ref 12.0–15.0)
MCH: 34.2 pg — ABNORMAL HIGH (ref 26.0–34.0)
MCH: 33.8 pg (ref 26.0–34.0)
MCHC: 34.1 g/dL (ref 30.0–36.0)
MCHC: 33.8 g/dL (ref 30.0–36.0)
MCV: 100.3 fL — ABNORMAL HIGH (ref 78.0–100.0)
MCV: 100 fL (ref 78.0–100.0)
PLATELETS: 137 10*3/uL — AB (ref 150–400)
Platelets: 168 10*3/uL (ref 150–400)
RBC: 3.3 MIL/uL — AB (ref 3.87–5.11)
RBC: 3.31 MIL/uL — AB (ref 3.87–5.11)
RDW: 12.6 % (ref 11.5–15.5)
RDW: 12.5 % (ref 11.5–15.5)
WBC: 9.5 10*3/uL (ref 4.0–10.5)
WBC: 6.5 10*3/uL (ref 4.0–10.5)

## 2016-07-16 LAB — MRSA PCR SCREENING: MRSA BY PCR: NEGATIVE

## 2016-07-16 LAB — PROTIME-INR
INR: 1.04
PROTHROMBIN TIME: 13.6 s (ref 11.4–15.2)

## 2016-07-16 LAB — APTT: aPTT: 31 seconds (ref 24–36)

## 2016-07-16 LAB — CREATININE, SERUM: CREATININE: 0.45 mg/dL (ref 0.44–1.00)

## 2016-07-16 LAB — HEPARIN LEVEL (UNFRACTIONATED): HEPARIN UNFRACTIONATED: 0.18 [IU]/mL — AB (ref 0.30–0.70)

## 2016-07-16 MED ORDER — ENOXAPARIN SODIUM 40 MG/0.4ML ~~LOC~~ SOLN: 40.0000 mg | SUBCUTANEOUS | Status: DC

## 2016-07-16 MED ORDER — HYDROMORPHONE HCL 2 MG/ML IJ SOLN: 1.0000 mg | INTRAMUSCULAR | Status: DC | PRN

## 2016-07-16 MED ORDER — ONDANSETRON HCL 4 MG PO TABS: 4.0000 mg | ORAL_TABLET | Freq: Four times a day (QID) | ORAL | Status: DC | PRN

## 2016-07-16 MED ORDER — ONDANSETRON HCL 4 MG/2ML IJ SOLN
4.0000 mg | Freq: Four times a day (QID) | INTRAMUSCULAR | Status: DC | PRN
  Administered 2016-07-16: 4 mg via INTRAVENOUS
  Filled 2016-07-16: qty 2

## 2016-07-16 MED ORDER — HYDROMORPHONE HCL 1 MG/ML IJ SOLN: 1.0000 mg | INTRAMUSCULAR | Status: DC | PRN

## 2016-07-16 MED ORDER — TRAMADOL HCL 50 MG PO TABS
50.0000 mg | ORAL_TABLET | Freq: Four times a day (QID) | ORAL | Status: DC
  Administered 2016-07-17 – 2016-07-19 (×8): 50 mg via ORAL
  Filled 2016-07-16 (×8): qty 1

## 2016-07-16 MED ORDER — KCL IN DEXTROSE-NACL 20-5-0.9 MEQ/L-%-% IV SOLN
INTRAVENOUS | Status: DC
  Administered 2016-07-16: 06:00:00 via INTRAVENOUS
  Filled 2016-07-16: qty 1000

## 2016-07-16 MED ORDER — KETOROLAC TROMETHAMINE 30 MG/ML IJ SOLN
30.0000 mg | Freq: Four times a day (QID) | INTRAMUSCULAR | Status: DC | PRN
  Administered 2016-07-16 – 2016-07-17 (×4): 30 mg via INTRAVENOUS
  Filled 2016-07-16 (×4): qty 1

## 2016-07-16 MED ORDER — HYDROMORPHONE HCL 2 MG/ML IJ SOLN
1.0000 mg | Freq: Once | INTRAMUSCULAR | Status: AC
  Administered 2016-07-16: 1 mg via INTRAVENOUS
  Filled 2016-07-16: qty 1

## 2016-07-16 MED ORDER — OXYCODONE HCL 5 MG PO TABS
5.0000 mg | ORAL_TABLET | ORAL | Status: DC | PRN
  Administered 2016-07-16 – 2016-07-18 (×6): 5 mg via ORAL
  Filled 2016-07-16 (×7): qty 1

## 2016-07-16 MED ORDER — MORPHINE SULFATE (PF) 2 MG/ML IV SOLN: 1.0000 mg | INTRAVENOUS | Status: DC | PRN

## 2016-07-16 MED ORDER — DOCUSATE SODIUM 100 MG PO CAPS
100.0000 mg | ORAL_CAPSULE | Freq: Two times a day (BID) | ORAL | Status: DC
  Administered 2016-07-16 – 2016-07-19 (×6): 100 mg via ORAL
  Filled 2016-07-16 (×5): qty 1

## 2016-07-16 MED ORDER — LIDOCAINE-EPINEPHRINE (PF) 2 %-1:200000 IJ SOLN
20.0000 mL | Freq: Once | INTRAMUSCULAR | Status: AC
  Administered 2016-07-16: 20 mL via INTRADERMAL
  Filled 2016-07-16: qty 20

## 2016-07-16 MED ORDER — METHOCARBAMOL 500 MG PO TABS
1000.0000 mg | ORAL_TABLET | Freq: Three times a day (TID) | ORAL | Status: DC | PRN
  Administered 2016-07-16 – 2016-07-18 (×5): 1000 mg via ORAL
  Filled 2016-07-16 (×6): qty 2

## 2016-07-16 MED ORDER — HYDROMORPHONE HCL 2 MG/ML IJ SOLN
0.5000 mg | Freq: Once | INTRAMUSCULAR | Status: AC
Start: 2016-07-16 — End: 2016-07-16
  Administered 2016-07-16: 0.5 mg via INTRAVENOUS
  Filled 2016-07-16: qty 1

## 2016-07-16 MED ORDER — ACETAMINOPHEN 325 MG PO TABS
650.0000 mg | ORAL_TABLET | Freq: Four times a day (QID) | ORAL | Status: DC | PRN
  Administered 2016-07-18: 650 mg via ORAL
  Filled 2016-07-16: qty 2

## 2016-07-16 MED ORDER — ONDANSETRON HCL 4 MG/2ML IJ SOLN
4.0000 mg | Freq: Once | INTRAMUSCULAR | Status: AC
  Administered 2016-07-16: 4 mg via INTRAVENOUS

## 2016-07-16 MED ORDER — HEPARIN (PORCINE) IN NACL 100-0.45 UNIT/ML-% IJ SOLN
1150.0000 [IU]/h | INTRAMUSCULAR | Status: DC
  Administered 2016-07-16: 850 [IU]/h via INTRAVENOUS
  Filled 2016-07-16 (×4): qty 250

## 2016-07-16 MED ORDER — PROMETHAZINE HCL 25 MG/ML IJ SOLN
12.5000 mg | INTRAMUSCULAR | Status: DC | PRN
  Administered 2016-07-16: 12.5 mg via INTRAVENOUS
  Filled 2016-07-16: qty 1
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
## 2016-07-16 NOTE — ED Notes (Signed)
Pt placed back on bedside monitor per RN request.
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
## 2016-07-17 ENCOUNTER — Inpatient Hospital Stay (HOSPITAL_COMMUNITY): Payer: PRIVATE HEALTH INSURANCE

## 2016-07-17 ENCOUNTER — Encounter (HOSPITAL_COMMUNITY): Payer: Self-pay | Admitting: *Deleted

## 2016-07-17 DIAGNOSIS — S42022A Displaced fracture of shaft of left clavicle, initial encounter for closed fracture: Secondary | ICD-10-CM

## 2016-07-17 LAB — CBC
HCT: 32.1 % — ABNORMAL LOW (ref 36.0–46.0)
HEMATOCRIT: 32.4 % — AB (ref 36.0–46.0)
HEMOGLOBIN: 11 g/dL — AB (ref 12.0–15.0)
Hemoglobin: 10.9 g/dL — ABNORMAL LOW (ref 12.0–15.0)
MCH: 34.1 pg — AB (ref 26.0–34.0)
MCH: 34.1 pg — ABNORMAL HIGH (ref 26.0–34.0)
MCHC: 34 g/dL (ref 30.0–36.0)
MCHC: 34 g/dL (ref 30.0–36.0)
MCV: 100.3 fL — AB (ref 78.0–100.0)
MCV: 100.3 fL — AB (ref 78.0–100.0)
PLATELETS: 138 10*3/uL — AB (ref 150–400)
Platelets: 132 10*3/uL — ABNORMAL LOW (ref 150–400)
RBC: 3.23 MIL/uL — ABNORMAL LOW (ref 3.87–5.11)
RBC: 3.2 MIL/uL — AB (ref 3.87–5.11)
RDW: 12.4 % (ref 11.5–15.5)
RDW: 12.3 % (ref 11.5–15.5)
WBC: 4.9 10*3/uL (ref 4.0–10.5)
WBC: 6 10*3/uL (ref 4.0–10.5)

## 2016-07-17 LAB — COMPREHENSIVE METABOLIC PANEL
ALBUMIN: 3.5 g/dL (ref 3.5–5.0)
ALT: 119 U/L — ABNORMAL HIGH (ref 14–54)
ANION GAP: 5 (ref 5–15)
AST: 156 U/L — ABNORMAL HIGH (ref 15–41)
Alkaline Phosphatase: 66 U/L (ref 38–126)
BILIRUBIN TOTAL: 1 mg/dL (ref 0.3–1.2)
BUN: 6 mg/dL (ref 6–20)
CALCIUM: 8.7 mg/dL — AB (ref 8.9–10.3)
CO2: 31 mmol/L (ref 22–32)
Chloride: 101 mmol/L (ref 101–111)
Creatinine, Ser: 0.5 mg/dL (ref 0.44–1.00)
GFR calc non Af Amer: >60 mL/min (ref >60)
GLUCOSE: 110 mg/dL — AB (ref 65–99)
POTASSIUM: 4.3 mmol/L (ref 3.5–5.1)
SODIUM: 137 mmol/L (ref 135–145)
TOTAL PROTEIN: 5.8 g/dL — AB (ref 6.5–8.1)

## 2016-07-17 LAB — HEPARIN LEVEL (UNFRACTIONATED)
HEPARIN UNFRACTIONATED: 0.27 [IU]/mL — AB (ref 0.30–0.70)
Heparin Unfractionated: 0.34 IU/mL (ref 0.30–0.70)
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
## 2016-07-17 NOTE — Consult Note (Signed)
[facility]  Subjective  - no complaints, no neuro symptoms   Objective 109/81 83 97.8 F (36.6 C) (Oral) 20 91%  Intake/Output Summary (Last 24 hours) at 07/17/16 0723 Last data filed at 07/17/16 0600  Gross per 24 hour  Intake           652.65 ml  Output                0 ml  Net           652.65 ml   Alert and oriented x 3  UE/LE motor intact no facial asymmetry  Assessment/Planning: Continue heparin until all procedures complete Transition to Eliquis if no new contraindications I will arrange follow up with her as an outpt  Plan discussed with pt and husband  Fabienne BrunsFields, Jaydyn Menon 07/17/2016 7:23 AM --  Laboratory Lab Results:  Recent Labs  07/16/16 1010 07/17/16 0348  WBC 6.5 4.9  HGB 11.2* 11.0*  HCT 33.1* 32.4*  PLT 137* 132*   BMET  Recent Labs  07/16/16 1010 07/17/16 0348  NA 136 137  K 3.9 4.3  CL 100* 101  CO2 26 31  GLUCOSE 119* 110*  BUN <5* 6  CREATININE 0.54 0.50  CALCIUM 8.4* 8.7*    COAG Lab Results  Component Value Date   INR 1.04 07/16/2016   INR 1.01 07/15/2016   No results found for: PTT

## 2016-07-17 NOTE — Progress Notes (Signed)
Orthopedic Tech Progress Note Patient Details:  [name] MerlCasey H Linam 1980/10/28 [id number]  Patient ID: [name] [name], female   DOB: 1980/10/28, 35 y.o.   MRN: [id number]   Nikki DomCrawford, Erminio Nygard 07/17/2016, 3:12 PM Viewed order from doctor's order list

## 2016-07-17 NOTE — Progress Notes (Signed)
Orthopedic Tech Progress Note Patient Details:  [name] [name] 01/14/1981 [id number]  Ortho Devices Type of Ortho Device: Arm sling Ortho Device/Splint Location: lue Ortho Device/Splint Interventions: Application   [name] [name] 07/17/2016, 3:12 PM
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
## 2016-07-18 ENCOUNTER — Inpatient Hospital Stay (HOSPITAL_COMMUNITY): Payer: PRIVATE HEALTH INSURANCE

## 2016-07-18 ENCOUNTER — Telehealth: Payer: Self-pay | Admitting: Vascular Surgery

## 2016-07-18 ENCOUNTER — Encounter (HOSPITAL_COMMUNITY): Payer: Self-pay | Admitting: *Deleted

## 2016-07-18 LAB — CBC
HEMATOCRIT: 31.5 % — AB (ref 36.0–46.0)
HEMOGLOBIN: 10.6 g/dL — AB (ref 12.0–15.0)
MCH: 33.9 pg (ref 26.0–34.0)
MCHC: 33.7 g/dL (ref 30.0–36.0)
MCV: 100.6 fL — ABNORMAL HIGH (ref 78.0–100.0)
Platelets: 123 10*3/uL — ABNORMAL LOW (ref 150–400)
RBC: 3.13 MIL/uL — ABNORMAL LOW (ref 3.87–5.11)
RDW: 12.2 % (ref 11.5–15.5)
WBC: 5.2 10*3/uL (ref 4.0–10.5)

## 2016-07-18 LAB — HEPARIN LEVEL (UNFRACTIONATED): HEPARIN UNFRACTIONATED: 0.3 [IU]/mL (ref 0.30–0.70)

## 2016-07-18 MED ORDER — MAGNESIUM CITRATE PO SOLN
1.0000 | Freq: Once | ORAL | Status: AC
  Administered 2016-07-18: 1 via ORAL
  Filled 2016-07-18: qty 296

## 2016-07-18 MED ORDER — SENNA 8.6 MG PO TABS
1.0000 | ORAL_TABLET | Freq: Two times a day (BID) | ORAL | Status: DC
  Administered 2016-07-18 – 2016-07-19 (×2): 8.6 mg via ORAL
  Filled 2016-07-18 (×2): qty 1

## 2016-07-18 MED ORDER — APIXABAN 5 MG PO TABS
5.0000 mg | ORAL_TABLET | Freq: Two times a day (BID) | ORAL | Status: DC
  Administered 2016-07-18 – 2016-07-19 (×2): 5 mg via ORAL
  Filled 2016-07-18 (×2): qty 1

## 2016-07-18 MED ORDER — POLYETHYLENE GLYCOL 3350 17 G PO PACK
17.0000 g | PACK | Freq: Every day | ORAL | Status: DC
  Administered 2016-07-18 – 2016-07-19 (×2): 17 g via ORAL
  Filled 2016-07-18 (×2): qty 1

## 2016-07-18 NOTE — Progress Notes (Signed)
No chest tube output this am.

## 2016-07-18 NOTE — Progress Notes (Signed)
Patient ID: Waldon MerlCasey H Kofoed, female   DOB: [birth year], 35 y.o.   MRN: [id number] L CT removed. She tolerated well. Violeta GelinasBurke Krisha Beegle, MD, MPH, FACS Trauma: [phone] General Surgery: [phone]

## 2016-07-18 NOTE — Progress Notes (Signed)
PT Cancellation Note  Patient Details Name: [name] [name] MRN: [id number] DOB: [birth year]   Cancelled Treatment:    Reason Eval/Treat Not Completed: PT screened, no needs identified, will sign off.  Pt up independently around room and unit.  Discussed with pt activity at home while maintaining L UE in sling.  At this time no PT needs, will sign off.     Sunny SchleinRitenour, [name], [place] [phone] 07/18/2016, 11:16 AM
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
## 2016-07-18 NOTE — Progress Notes (Signed)
At 2230 last night,chest tube output was marked at 110. Previous mark was at 3955
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
## 2016-07-18 NOTE — Progress Notes (Signed)
Pt report still not able to have a BM; pt and spouse requested to see if MD can order Mag citrate and senokot for pt. Dr. Janee Mornhompson notified and new order received. Dionne BucyP. Amo Bob Eastwood RN

## 2016-07-18 NOTE — Care Management Note (Signed)
Case Management Note  Patient Details  Name: [name] [name] MRN: [id number] Date of Birth: [birth year]  Subjective/Objective:   CT discontinued today.  Planning home tomorrow on Eliquis therapy is CXR okay.                   Action/Plan: Met with pt and spouse to discuss Eliquis savings cards. Pt given free 30 day trial card for Eliquis and copay card, which will allow her to receive med for $10/month after activation.  Pt and husband appreciative of help, and hopeful for dc in am.    Expected Discharge Date:    07/19/16              Expected Discharge Plan:  Home/Self Care  In-House Referral:     Discharge planning Services  CM Consult, Medication Assistance  Post Acute Care Choice:    Choice offered to:     DME Arranged:    DME Agency:     HH Arranged:    HH Agency:     Status of Service:  Completed, signed off  If discussed at [employer], dates discussed:    Additional Comments:  Reinaldo Raddle, RN, BSN  Trauma/Neuro ICU Case Manager [phone]

## 2016-07-18 NOTE — Progress Notes (Signed)
Pt heparin drip stopped at 0800. P. Seleta RhymesAmo Marnae Madani RN
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
## 2016-07-19 ENCOUNTER — Inpatient Hospital Stay (HOSPITAL_COMMUNITY): Payer: PRIVATE HEALTH INSURANCE

## 2016-07-19 LAB — CBC
HCT: 28.4 % — ABNORMAL LOW (ref 36.0–46.0)
Hemoglobin: 9.8 g/dL — ABNORMAL LOW (ref 12.0–15.0)
MCH: 34.1 pg — ABNORMAL HIGH (ref 26.0–34.0)
MCHC: 34.5 g/dL (ref 30.0–36.0)
MCV: 99 fL (ref 78.0–100.0)
Platelets: 137 10*3/uL — ABNORMAL LOW (ref 150–400)
RBC: 2.87 MIL/uL — ABNORMAL LOW (ref 3.87–5.11)
RDW: 12 % (ref 11.5–15.5)
WBC: 6.1 10*3/uL (ref 4.0–10.5)

## 2016-07-19 LAB — HEPARIN LEVEL (UNFRACTIONATED): Heparin Unfractionated: 1.82 IU/mL — ABNORMAL HIGH (ref 0.30–0.70)

## 2016-07-19 MED ORDER — HYDROCODONE-ACETAMINOPHEN 5-325 MG PO TABS: 1.0000 | ORAL_TABLET | ORAL | 0 refills | Status: DC | PRN

## 2016-07-19 MED ORDER — APIXABAN 5 MG PO TABS: 5.0000 mg | ORAL_TABLET | Freq: Two times a day (BID) | ORAL | 3 refills | Status: DC

## 2016-07-19 NOTE — Progress Notes (Signed)
Patient ID: [name] MerlCasey H [name], female   DOB: [birth year], 35 y.o.   MRN: [id number]  Doing well Pain controlled No SOB CXR without Pneumo Lungs clear  Plan: discharge

## 2016-07-19 NOTE — Progress Notes (Signed)
Pt discharged home in stable condition after going over discharge instructions with no concerns voiced. AVS and discharge meds script given pre discharge
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
## 2016-07-19 NOTE — Discharge Instructions (Signed)
Sling for comfort  Miralax for constipation  Call or head to the ER for any shortness of breath or neurologic changes

## 2016-07-20 ENCOUNTER — Inpatient Hospital Stay (HOSPITAL_COMMUNITY)
Admission: EM | Admit: 2016-07-20 | Discharge: 2016-07-22 | DRG: 100 | Disposition: A | Discharge status: Home / Self Care | Payer: PRIVATE HEALTH INSURANCE | Attending: Internal Medicine | Admitting: Internal Medicine

## 2016-07-20 ENCOUNTER — Emergency Department (HOSPITAL_COMMUNITY): Payer: PRIVATE HEALTH INSURANCE

## 2016-07-20 ENCOUNTER — Inpatient Hospital Stay (HOSPITAL_COMMUNITY): Payer: PRIVATE HEALTH INSURANCE

## 2016-07-20 ENCOUNTER — Encounter (HOSPITAL_COMMUNITY): Payer: Self-pay | Admitting: Emergency Medicine

## 2016-07-20 ENCOUNTER — Observation Stay (HOSPITAL_COMMUNITY): Payer: PRIVATE HEALTH INSURANCE

## 2016-07-20 DIAGNOSIS — Z7901 Long term (current) use of anticoagulants: Secondary | ICD-10-CM | POA: Diagnosis not present

## 2016-07-20 DIAGNOSIS — S15009A Unspecified injury of unspecified carotid artery, initial encounter: Secondary | ICD-10-CM | POA: Diagnosis present

## 2016-07-20 DIAGNOSIS — F10939 Alcohol use, unspecified with withdrawal, unspecified: Secondary | ICD-10-CM

## 2016-07-20 DIAGNOSIS — R451 Restlessness and agitation: Secondary | ICD-10-CM | POA: Diagnosis present

## 2016-07-20 DIAGNOSIS — F10239 Alcohol dependence with withdrawal, unspecified: Secondary | ICD-10-CM

## 2016-07-20 DIAGNOSIS — R443 Hallucinations, unspecified: Secondary | ICD-10-CM | POA: Diagnosis present

## 2016-07-20 DIAGNOSIS — G9340 Encephalopathy, unspecified: Secondary | ICD-10-CM | POA: Diagnosis present

## 2016-07-20 DIAGNOSIS — T40605A Adverse effect of unspecified narcotics, initial encounter: Secondary | ICD-10-CM | POA: Diagnosis present

## 2016-07-20 DIAGNOSIS — S15002D Unspecified injury of left carotid artery, subsequent encounter: Secondary | ICD-10-CM | POA: Diagnosis not present

## 2016-07-20 DIAGNOSIS — Z79899 Other long term (current) drug therapy: Secondary | ICD-10-CM | POA: Diagnosis not present

## 2016-07-20 DIAGNOSIS — Z791 Long term (current) use of non-steroidal anti-inflammatories (NSAID): Secondary | ICD-10-CM | POA: Diagnosis not present

## 2016-07-20 DIAGNOSIS — G40409 Other generalized epilepsy and epileptic syndromes, not intractable, without status epilepticus: Principal | ICD-10-CM | POA: Diagnosis present

## 2016-07-20 DIAGNOSIS — F10231 Alcohol dependence with withdrawal delirium: Secondary | ICD-10-CM | POA: Diagnosis present

## 2016-07-20 DIAGNOSIS — Z79891 Long term (current) use of opiate analgesic: Secondary | ICD-10-CM

## 2016-07-20 DIAGNOSIS — R569 Unspecified convulsions: Secondary | ICD-10-CM

## 2016-07-20 DIAGNOSIS — D500 Iron deficiency anemia secondary to blood loss (chronic): Secondary | ICD-10-CM | POA: Diagnosis present

## 2016-07-20 DIAGNOSIS — J939 Pneumothorax, unspecified: Secondary | ICD-10-CM | POA: Diagnosis present

## 2016-07-20 DIAGNOSIS — D649 Anemia, unspecified: Secondary | ICD-10-CM

## 2016-07-20 DIAGNOSIS — R34 Anuria and oliguria: Secondary | ICD-10-CM | POA: Diagnosis present

## 2016-07-20 DIAGNOSIS — Z8709 Personal history of other diseases of the respiratory system: Secondary | ICD-10-CM

## 2016-07-20 DIAGNOSIS — E876 Hypokalemia: Secondary | ICD-10-CM | POA: Diagnosis present

## 2016-07-20 LAB — CBC
HCT: 27.6 % — ABNORMAL LOW (ref 36.0–46.0)
Hemoglobin: 9.6 g/dL — ABNORMAL LOW (ref 12.0–15.0)
MCH: 34.4 pg — AB (ref 26.0–34.0)
MCHC: 34.8 g/dL (ref 30.0–36.0)
MCV: 98.9 fL (ref 78.0–100.0)
Platelets: 135 10*3/uL — ABNORMAL LOW (ref 150–400)
RBC: 2.79 MIL/uL — ABNORMAL LOW (ref 3.87–5.11)
RDW: 12.2 % (ref 11.5–15.5)
WBC: 7.1 10*3/uL (ref 4.0–10.5)

## 2016-07-20 LAB — COMPREHENSIVE METABOLIC PANEL
ALBUMIN: 3.9 g/dL (ref 3.5–5.0)
ALT: 95 U/L — ABNORMAL HIGH (ref 14–54)
ANION GAP: 9 (ref 5–15)
AST: 96 U/L — AB (ref 15–41)
Alkaline Phosphatase: 66 U/L (ref 38–126)
BILIRUBIN TOTAL: 1.1 mg/dL (ref 0.3–1.2)
BUN: 7 mg/dL (ref 6–20)
CHLORIDE: 99 mmol/L — AB (ref 101–111)
CO2: 25 mmol/L (ref 22–32)
Calcium: 9 mg/dL (ref 8.9–10.3)
Creatinine, Ser: 0.58 mg/dL (ref 0.44–1.00)
GFR calc Af Amer: >60 mL/min (ref >60)
Glucose, Bld: 105 mg/dL — ABNORMAL HIGH (ref 65–99)
POTASSIUM: 3.1 mmol/L — AB (ref 3.5–5.1)
Sodium: 133 mmol/L — ABNORMAL LOW (ref 135–145)
TOTAL PROTEIN: 6.3 g/dL — AB (ref 6.5–8.1)

## 2016-07-20 LAB — BASIC METABOLIC PANEL
ANION GAP: 7 (ref 5–15)
BUN: 6 mg/dL (ref 6–20)
CALCIUM: 8.8 mg/dL — AB (ref 8.9–10.3)
CO2: 25 mmol/L (ref 22–32)
Chloride: 104 mmol/L (ref 101–111)
Creatinine, Ser: 0.48 mg/dL (ref 0.44–1.00)
GFR calc non Af Amer: >60 mL/min (ref >60)
GLUCOSE: 101 mg/dL — AB (ref 65–99)
POTASSIUM: 3.5 mmol/L (ref 3.5–5.1)
Sodium: 136 mmol/L (ref 135–145)

## 2016-07-20 LAB — CBC WITH DIFFERENTIAL/PLATELET
BASOS ABS: 0 10*3/uL (ref 0.0–0.1)
BASOS PCT: 1 %
Eosinophils Absolute: 0.1 10*3/uL (ref 0.0–0.7)
Eosinophils Relative: 1 %
HEMATOCRIT: 26.6 % — AB (ref 36.0–46.0)
HEMOGLOBIN: 9.3 g/dL — AB (ref 12.0–15.0)
LYMPHS PCT: 16 %
Lymphs Abs: 1.2 10*3/uL (ref 0.7–4.0)
MCH: 34.6 pg — ABNORMAL HIGH (ref 26.0–34.0)
MCHC: 35 g/dL (ref 30.0–36.0)
MCV: 98.9 fL (ref 78.0–100.0)
MONO ABS: 1.2 10*3/uL — AB (ref 0.1–1.0)
MONOS PCT: 16 %
NEUTROS ABS: 5.4 10*3/uL (ref 1.7–7.7)
NEUTROS PCT: 66 %
Platelets: 142 10*3/uL — ABNORMAL LOW (ref 150–400)
RBC: 2.69 MIL/uL — ABNORMAL LOW (ref 3.87–5.11)
RDW: 12.4 % (ref 11.5–15.5)
WBC: 8 10*3/uL (ref 4.0–10.5)

## 2016-07-20 LAB — RAPID URINE DRUG SCREEN, HOSP PERFORMED
Amphetamines: NOT DETECTED
Barbiturates: NOT DETECTED
Benzodiazepines: POSITIVE — AB
COCAINE: NOT DETECTED
OPIATES: NOT DETECTED
TETRAHYDROCANNABINOL: NOT DETECTED

## 2016-07-20 LAB — I-STAT CHEM 8, ED
BUN: 7 mg/dL (ref 6–20)
CREATININE: 0.5 mg/dL (ref 0.44–1.00)
Calcium, Ion: 1.07 mmol/L — ABNORMAL LOW (ref 1.15–1.40)
Chloride: 99 mmol/L — ABNORMAL LOW (ref 101–111)
Glucose, Bld: 101 mg/dL — ABNORMAL HIGH (ref 65–99)
HEMATOCRIT: 27 % — AB (ref 36.0–46.0)
HEMOGLOBIN: 9.2 g/dL — AB (ref 12.0–15.0)
POTASSIUM: 3.2 mmol/L — AB (ref 3.5–5.1)
SODIUM: 135 mmol/L (ref 135–145)
TCO2: 25 mmol/L (ref 0–100)

## 2016-07-20 LAB — GLUCOSE, CAPILLARY: Glucose-Capillary: 123 mg/dL — ABNORMAL HIGH (ref 65–99)

## 2016-07-20 LAB — APTT: APTT: 34 s (ref 24–36)

## 2016-07-20 LAB — MAGNESIUM: Magnesium: 2.4 mg/dL (ref 1.7–2.4)

## 2016-07-20 LAB — DIFFERENTIAL
BASOS ABS: 0 10*3/uL (ref 0.0–0.1)
BASOS PCT: 1 %
EOS ABS: 0.1 10*3/uL (ref 0.0–0.7)
EOS PCT: 1 %
Lymphocytes Relative: 9 %
Lymphs Abs: 0.6 10*3/uL — ABNORMAL LOW (ref 0.7–4.0)
MONOS PCT: 17 %
Monocytes Absolute: 1.2 10*3/uL — ABNORMAL HIGH (ref 0.1–1.0)
NEUTROS PCT: 72 %
Neutro Abs: 5.1 10*3/uL (ref 1.7–7.7)

## 2016-07-20 LAB — PROTIME-INR
INR: 1.12
Prothrombin Time: 14.5 seconds (ref 11.4–15.2)

## 2016-07-20 LAB — ETHANOL: Alcohol, Ethyl (B): <5 mg/dL (ref <5)

## 2016-07-20 LAB — I-STAT TROPONIN, ED: TROPONIN I, POC: 0.02 ng/mL (ref 0.00–0.08)

## 2016-07-20 LAB — MRSA PCR SCREENING: MRSA by PCR: NEGATIVE

## 2016-07-20 LAB — HCG, QUANTITATIVE, PREGNANCY: hCG, Beta Chain, Quant, S: 3 m[IU]/mL (ref <5)

## 2016-07-20 MED ORDER — IBUPROFEN 400 MG PO TABS
400.0000 mg | ORAL_TABLET | Freq: Four times a day (QID) | ORAL | Status: DC | PRN
  Administered 2016-07-21: 400 mg via ORAL
  Filled 2016-07-20: qty 1

## 2016-07-20 MED ORDER — DEXTROSE-NACL 5-0.45 % IV SOLN
INTRAVENOUS | Status: DC
  Administered 2016-07-20 – 2016-07-21 (×3): via INTRAVENOUS

## 2016-07-20 MED ORDER — LORAZEPAM 2 MG/ML IJ SOLN: 1.0000 mg | INTRAMUSCULAR | Status: DC | PRN

## 2016-07-20 MED ORDER — MIDAZOLAM HCL 5 MG/ML IJ SOLN: 4.0000 mg | INTRAMUSCULAR | Status: DC | PRN

## 2016-07-20 MED ORDER — SODIUM CHLORIDE 0.9 % IV BOLUS (SEPSIS)
1000.0000 mL | Freq: Once | INTRAVENOUS | Status: AC
  Administered 2016-07-20: 1000 mL via INTRAVENOUS

## 2016-07-20 MED ORDER — POTASSIUM CHLORIDE 20 MEQ/15ML (10%) PO SOLN
40.0000 meq | Freq: Once | ORAL | Status: AC
  Administered 2016-07-20: 40 meq via ORAL
  Filled 2016-07-20: qty 30

## 2016-07-20 MED ORDER — SODIUM CHLORIDE 0.9 % IV SOLN
INTRAVENOUS | Status: DC
  Administered 2016-07-20: 07:00:00 via INTRAVENOUS

## 2016-07-20 MED ORDER — LORAZEPAM 1 MG PO TABS: 0.0000 mg | ORAL_TABLET | Freq: Two times a day (BID) | ORAL | Status: DC

## 2016-07-20 MED ORDER — MIDAZOLAM HCL 5 MG/ML IJ SOLN: 4.0000 mg | Freq: Once | INTRAMUSCULAR | Status: DC

## 2016-07-20 MED ORDER — LORAZEPAM 1 MG PO TABS
0.0000 mg | ORAL_TABLET | Freq: Four times a day (QID) | ORAL | Status: DC
  Administered 2016-07-20: 1 mg via ORAL

## 2016-07-20 MED ORDER — LORAZEPAM 1 MG PO TABS
1.0000 mg | ORAL_TABLET | Freq: Four times a day (QID) | ORAL | Status: DC | PRN
  Administered 2016-07-20: 1 mg via ORAL
  Filled 2016-07-20: qty 1

## 2016-07-20 MED ORDER — ADULT MULTIVITAMIN W/MINERALS CH: 1.0000 | ORAL_TABLET | Freq: Every day | ORAL | Status: DC

## 2016-07-20 MED ORDER — LORAZEPAM 2 MG/ML IJ SOLN
1.0000 mg | Freq: Once | INTRAMUSCULAR | Status: AC
  Administered 2016-07-20: 1 mg via INTRAVENOUS
  Filled 2016-07-20: qty 1

## 2016-07-20 MED ORDER — LORAZEPAM 2 MG/ML IJ SOLN: 1.0000 mg | Freq: Four times a day (QID) | INTRAMUSCULAR | Status: DC | PRN

## 2016-07-20 MED ORDER — HALOPERIDOL LACTATE 5 MG/ML IJ SOLN
5.0000 mg | INTRAMUSCULAR | Status: AC
  Administered 2016-07-20: 5 mg via INTRAVENOUS
  Filled 2016-07-20: qty 1

## 2016-07-20 MED ORDER — SODIUM CHLORIDE 0.9% FLUSH
3.0000 mL | Freq: Two times a day (BID) | INTRAVENOUS | Status: DC
  Administered 2016-07-20 – 2016-07-22 (×3): 3 mL via INTRAVENOUS

## 2016-07-20 MED ORDER — LORAZEPAM 2 MG/ML IJ SOLN
1.0000 mg | INTRAMUSCULAR | Status: DC | PRN
  Administered 2016-07-20 – 2016-07-21 (×3): 2 mg via INTRAVENOUS
  Filled 2016-07-20 (×3): qty 1

## 2016-07-20 MED ORDER — THIAMINE HCL 100 MG/ML IJ SOLN
100.0000 mg | Freq: Every day | INTRAMUSCULAR | Status: DC
  Administered 2016-07-20: 100 mg via INTRAVENOUS
  Filled 2016-07-20 (×2): qty 2

## 2016-07-20 MED ORDER — DEXMEDETOMIDINE HCL IN NACL 400 MCG/100ML IV SOLN
0.4000 ug/kg/h | INTRAVENOUS | Status: DC
  Administered 2016-07-20 (×4): 1.2 ug/kg/h via INTRAVENOUS
  Administered 2016-07-21: 1 ug/kg/h via INTRAVENOUS
  Filled 2016-07-20 (×2): qty 100
  Filled 2016-07-20: qty 50
  Filled 2016-07-20 (×3): qty 100
  Filled 2016-07-20: qty 50

## 2016-07-20 MED ORDER — ACETAMINOPHEN 325 MG PO TABS: 650.0000 mg | ORAL_TABLET | Freq: Four times a day (QID) | ORAL | Status: DC | PRN

## 2016-07-20 MED ORDER — FOLIC ACID 1 MG PO TABS
1.0000 mg | ORAL_TABLET | Freq: Every day | ORAL | Status: DC
  Administered 2016-07-21 – 2016-07-22 (×2): 1 mg via ORAL
  Filled 2016-07-20 (×2): qty 1

## 2016-07-20 MED ORDER — ONDANSETRON HCL 4 MG/2ML IJ SOLN: 4.0000 mg | Freq: Three times a day (TID) | INTRAMUSCULAR | Status: DC | PRN

## 2016-07-20 MED ORDER — MIDAZOLAM HCL 2 MG/2ML IJ SOLN
4.0000 mg | Freq: Once | INTRAMUSCULAR | Status: AC
  Administered 2016-07-20: 4 mg via INTRAVENOUS
  Filled 2016-07-20: qty 4

## 2016-07-20 MED ORDER — CLONIDINE HCL 0.1 MG PO TABS
0.1000 mg | ORAL_TABLET | Freq: Three times a day (TID) | ORAL | Status: DC
  Administered 2016-07-20 – 2016-07-21 (×4): 0.1 mg via ORAL
  Filled 2016-07-20 (×7): qty 1

## 2016-07-20 MED ORDER — VITAMIN B-1 100 MG PO TABS
100.0000 mg | ORAL_TABLET | Freq: Every day | ORAL | Status: DC
  Administered 2016-07-21 – 2016-07-22 (×2): 100 mg via ORAL
  Filled 2016-07-20 (×2): qty 1

## 2016-07-20 MED ORDER — POLYETHYLENE GLYCOL 3350 17 G PO PACK
17.0000 g | PACK | Freq: Every day | ORAL | Status: DC | PRN
Start: 2016-07-20 — End: 2016-07-22
  Filled 2016-07-20: qty 1

## 2016-07-20 MED ORDER — LORAZEPAM 2 MG/ML IJ SOLN: 1.0000 mg | Freq: Once | INTRAMUSCULAR | Status: DC

## 2016-07-20 MED ORDER — ADULT MULTIVITAMIN W/MINERALS CH
1.0000 | ORAL_TABLET | Freq: Every day | ORAL | Status: DC
  Administered 2016-07-21 – 2016-07-22 (×2): 1 via ORAL
  Filled 2016-07-20 (×2): qty 1

## 2016-07-20 MED ORDER — POTASSIUM CHLORIDE CRYS ER 20 MEQ PO TBCR
40.0000 meq | EXTENDED_RELEASE_TABLET | Freq: Once | ORAL | Status: DC
  Filled 2016-07-20: qty 2

## 2016-07-20 MED ORDER — APIXABAN 5 MG PO TABS: 5.0000 mg | ORAL_TABLET | Freq: Two times a day (BID) | ORAL | Status: DC

## 2016-07-20 MED ORDER — HALOPERIDOL LACTATE 5 MG/ML IJ SOLN
INTRAMUSCULAR | Status: AC
  Administered 2016-07-20: 5 mg
  Filled 2016-07-20: qty 1
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
## 2016-07-20 NOTE — Progress Notes (Signed)
eLink Physician-Brief Progress Note Patient Name: [name] MerlCasey H Brozowski DOB: 1981-05-22 MRN: [id number]   Date of Service  07/20/2016  HPI/Events of Note  Patient c/o headache. AST and ALT mildly elevated. Creatinine = 0.48.   eICU Interventions  Will order: 1. Motrin 400 mg PO Q 6 hours PRN pain.      Intervention Category Intermediate Interventions: Pain - evaluation and management  Sommer,Steven Dennard Nipugene 07/20/2016, 8:32 PM

## 2016-07-20 NOTE — Progress Notes (Signed)
Patient with increasing agitation, hallucinations, tremors and confusion despite prn medication given. Family supportive and at bedside. MD aware of status. CIWA protocol administered. New orders from MD to transfer patient to ICU.  Report given to nurse when bed became available. Transported to floor in wheelchair with all belongings, family at side.

## 2016-07-20 NOTE — Progress Notes (Signed)
Added scheduled clonidine for agitation

## 2016-07-20 NOTE — Progress Notes (Signed)
eLink Physician-Brief Progress Note Patient Name: [name] [name] DOB: Oct 04, 1980 MRN: [id number]   Date of Service  07/20/2016  HPI/Events of Note  CXR from earlier today reveals a small L apical pneumothorax. Patient not on mechanical ventilation.   eICU Interventions  Will order: 1. Portable CXR at 12 midnight to assess progression vs resolution.      Intervention Category Major Interventions: Other:  [name] [name],[name] [name] 07/20/2016, 7:45 PM
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
## 2016-07-20 NOTE — Progress Notes (Signed)
Patient very agitated pulling on covers and codes trying to get out of bed unassisted disoriented to place and time Provider made aware order for one 1mg  ativan administered. Will continue to monitor.
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
## 2016-07-20 NOTE — Progress Notes (Signed)
eLink Physician-Brief Progress Note Patient Name: [name] [name] DOB: October 06, 1980 MRN: [id number]   Date of Service  07/20/2016  HPI/Events of Note  Oliguria - Bladder scan with 700 mL residual.   eICU Interventions  Will order: 1. I/O cath Q 6 hours PRN.      Intervention Category Intermediate Interventions: Oliguria - evaluation and management  [name] [place] 07/20/2016, 6:08 PM
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
## 2016-07-20 NOTE — Progress Notes (Signed)
Trying to get sample, pt through toilet paper in hat
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
## 2016-07-21 ENCOUNTER — Inpatient Hospital Stay (HOSPITAL_COMMUNITY): Payer: PRIVATE HEALTH INSURANCE

## 2016-07-21 DIAGNOSIS — S15002D Unspecified injury of left carotid artery, subsequent encounter: Secondary | ICD-10-CM

## 2016-07-21 DIAGNOSIS — F10939 Alcohol use, unspecified with withdrawal, unspecified: Secondary | ICD-10-CM

## 2016-07-21 DIAGNOSIS — F10239 Alcohol dependence with withdrawal, unspecified: Secondary | ICD-10-CM

## 2016-07-21 LAB — CBC
HCT: 26.5 % — ABNORMAL LOW (ref 36.0–46.0)
Hemoglobin: 9.1 g/dL — ABNORMAL LOW (ref 12.0–15.0)
MCH: 34.9 pg — ABNORMAL HIGH (ref 26.0–34.0)
MCHC: 34.3 g/dL (ref 30.0–36.0)
MCV: 101.5 fL — ABNORMAL HIGH (ref 78.0–100.0)
PLATELETS: 151 10*3/uL (ref 150–400)
RBC: 2.61 MIL/uL — ABNORMAL LOW (ref 3.87–5.11)
RDW: 12.8 % (ref 11.5–15.5)
WBC: 4.5 10*3/uL (ref 4.0–10.5)

## 2016-07-21 LAB — BASIC METABOLIC PANEL
Anion gap: 5 (ref 5–15)
Anion gap: 5 (ref 5–15)
BUN: <5 mg/dL — ABNORMAL LOW (ref 6–20)
CHLORIDE: 107 mmol/L (ref 101–111)
CO2: 24 mmol/L (ref 22–32)
CO2: 24 mmol/L (ref 22–32)
CREATININE: 0.4 mg/dL — AB (ref 0.44–1.00)
CREATININE: 0.4 mg/dL — AB (ref 0.44–1.00)
Calcium: 8.3 mg/dL — ABNORMAL LOW (ref 8.9–10.3)
Calcium: 8.4 mg/dL — ABNORMAL LOW (ref 8.9–10.3)
Chloride: 107 mmol/L (ref 101–111)
GFR calc Af Amer: >60 mL/min (ref >60)
GFR calc non Af Amer: >60 mL/min (ref >60)
Glucose, Bld: 159 mg/dL — ABNORMAL HIGH (ref 65–99)
Glucose, Bld: 138 mg/dL — ABNORMAL HIGH (ref 65–99)
Potassium: 3 mmol/L — ABNORMAL LOW (ref 3.5–5.1)
Potassium: 3.7 mmol/L (ref 3.5–5.1)
SODIUM: 136 mmol/L (ref 135–145)
SODIUM: 136 mmol/L (ref 135–145)

## 2016-07-21 LAB — MAGNESIUM: Magnesium: 2.2 mg/dL (ref 1.7–2.4)

## 2016-07-21 LAB — PHOSPHORUS: PHOSPHORUS: 3.7 mg/dL (ref 2.5–4.6)

## 2016-07-21 MED ORDER — POTASSIUM CHLORIDE CRYS ER 20 MEQ PO TBCR
40.0000 meq | EXTENDED_RELEASE_TABLET | Freq: Once | ORAL | Status: AC
  Administered 2016-07-21: 20 meq via ORAL
  Filled 2016-07-21: qty 2

## 2016-07-21 MED ORDER — APIXABAN 5 MG PO TABS
5.0000 mg | ORAL_TABLET | Freq: Two times a day (BID) | ORAL | Status: DC
  Administered 2016-07-21 – 2016-07-22 (×3): 5 mg via ORAL
  Filled 2016-07-21 (×4): qty 1

## 2016-07-21 MED ORDER — HEPARIN SODIUM (PORCINE) 5000 UNIT/ML IJ SOLN
5000.0000 [IU] | Freq: Three times a day (TID) | INTRAMUSCULAR | Status: DC
  Filled 2016-07-21: qty 1

## 2016-07-21 MED ORDER — LORAZEPAM 1 MG PO TABS: 1.0000 mg | ORAL_TABLET | Freq: Four times a day (QID) | ORAL | Status: DC | PRN

## 2016-07-21 MED ORDER — POTASSIUM CHLORIDE 10 MEQ/100ML IV SOLN
10.0000 meq | INTRAVENOUS | Status: AC
  Administered 2016-07-21 (×4): 10 meq via INTRAVENOUS
  Filled 2016-07-21 (×4): qty 100

## 2016-07-21 MED ORDER — GADOBENATE DIMEGLUMINE 529 MG/ML IV SOLN
12.0000 mL | Freq: Once | INTRAVENOUS | Status: AC | PRN
  Administered 2016-07-21: 12 mL via INTRAVENOUS

## 2016-07-21 MED ORDER — LORAZEPAM 2 MG/ML IJ SOLN: 1.0000 mg | Freq: Four times a day (QID) | INTRAMUSCULAR | Status: DC | PRN

## 2016-07-21 MED ORDER — DOCUSATE SODIUM 100 MG PO CAPS
100.0000 mg | ORAL_CAPSULE | Freq: Two times a day (BID) | ORAL | Status: DC | PRN
  Administered 2016-07-21: 100 mg via ORAL
  Filled 2016-07-21: qty 1

## 2016-07-21 NOTE — Progress Notes (Signed)
Patient checked earlier on bladder scan and had 212. Rechecked two hours later and it said 556. Asked everyone on the unit about foley certification and no one had gone to the class. I called 8 units asking for two people to come in/out patient. Called AC and she had not gone to foley class and stated that if it is impeding care then to go ahead and in/out. Patient had 700 after in/out and was able to have her potassium of 3.0 replaced with 4 runs of K. Will continue to monitor. Suzy Bouchardhompson, Tasean Mancha E, RN 07/21/2016 4:55 AM
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
## 2016-07-21 NOTE — Progress Notes (Signed)
eLink Physician-Brief Progress Note Patient Name: [name] [name] DOB: [birth year] MRN: [id number]   Date of Service  07/21/2016  HPI/Events of Note  Eliquis held yesterday. No DVT prophylaxis.  eICU Interventions  Will start Heparin SQ        [place] 07/21/2016, 6:24 AM

## 2016-07-21 NOTE — Progress Notes (Signed)
[facility] FOR [facility] LAB REPLACEMENT ONLY  The patient does [facility] based on the criteria listed below:   1. Is GFR >/= 40 ml/min? Yes.    Patient's GFR today is >60 2. Is urine output >/= 0.5 ml/kg/hr for the last 6 hours? Yes.   Patient's UOP is 1.9 ml/kg/hr 3. Is BUN < 60 mg/dL? Yes.    Patient's BUN today is <5 4. Abnormal electrolyte(s): K 3.0 5. Ordered repletion with: protocol 6. If [name] panic level lab has been reported, has the CCM MD in charge been notified? No..   Physician:    Markus DaftWHELAN, [name] [name] 07/21/2016 3:53 AM
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
## 2016-07-21 NOTE — Progress Notes (Signed)
Pt transferred from 2 M. Pt A & O x4, stable and comfortable.
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
## 2016-07-21 NOTE — Procedures (Signed)
ELECTROENCEPHALOGRAM REPORT  Date of Study: 07/21/2016  Patient's Name: [name] MerlCasey H [name] MRN: [id number] Date of Birth: 1981-08-10  Referring Provider: Felicie Mornavid Smith, PA-C  Clinical History: 35 year old woman with generalized seizure that was preceded by some disorientation and visual hallucinations. Possible alcohol withdrawal.  Medications: cloNIDine (CATAPRES) tablet 0.1 mg    dexmedetomidine (PRECEDEX) 400 MCG/100ML (4 mcg/mL) infusion   dextrose 5 %-0.45 % sodium chloride infusion   folic acid (FOLVITE) tablet 1 mg   heparin injection 5,000 Units   ibuprofen (ADVIL,MOTRIN) tablet 400 mg   LORazepam (ATIVAN) injection 1-4 mg   multivitamin with minerals tablet 1 tablet   ondansetron (ZOFRAN) injection 4 mg   polyethylene glycol (MIRALAX / GLYCOLAX) packet 17 g   sodium chloride flush (NS) 0.9 % injection 3 mL  L1 thiamine (B-1) injection 100 mg  L1 thiamine (VITAMIN B-1) tablet 100 mg   Technical Summary: A multichannel digital EEG recording measured by the international 10-20 system with electrodes applied with paste and impedances below 5000 ohms performed in our laboratory with EKG monitoring in an awake and drowsy patient.  Hyperventilation and photic stimulation were not performed.  The digital EEG was referentially recorded, reformatted, and digitally filtered in a variety of bipolar and referential montages for optimal display.    Description: The patient is awake and drowsy during the recording.  During maximal wakefulness, there is a symmetric, medium voltage 9 Hz posterior dominant rhythm that attenuates with eye opening.  The record is symmetric.  During drowsiness and sleep, there is an increase in theta slowing of the background.  Vertex waves and symmetric sleep spindles were not seen.  There were no epileptiform discharges or electrographic seizures seen.    EKG lead shows periods of irregular rhythm, but limited due to artifact.  Consider review of  telemetry.  Impression: This awake and drowsy EEG is normal.    Clinical Correlation: A normal EEG does not exclude a clinical diagnosis of epilepsy.  If further clinical questions remain, prolonged EEG may be helpful.  Clinical correlation is advised.   Shon MilletAdam Jaffe, DO

## 2016-07-21 NOTE — Progress Notes (Signed)
PT Cancellation Note  Patient Details Name: [name] [name] MRN: [id number] DOB: 1981-07-23   Cancelled Treatment:    Reason Eval/Treat Not Completed: PT screened, no needs identified, will sign off (pt seen by OT and no needs at this time for PT)   Delorse Lekabor, Roseanne Juenger Beth 07/21/2016, 1:39 PM Delaney MeigsMaija Tabor Nonna Renninger, PT [phone]

## 2016-07-21 NOTE — Progress Notes (Addendum)
Neurology Progress Note  Subjective: The patient complains of a dry mouth. She also reports some difficulty raising her left arm above shoulder level due to shoulder pain since her recent injury. She has no recollection of yesterday's seizure. She wants to know if she can have something to eat or drink and when she will be able to get out of bed. She has no headache, vision changes, weakness, numbness, tingling, double vision, vertigo, trouble speaking, or difficulty swallowing. She has a sitter at the bedside who reports some agitation last night but none so far this morning. She also reports some occasional jerking movements but no further seizures have been reported.   Current Meds:   Current Facility-Administered Medications:  .  cloNIDine (CATAPRES) tablet 0.1 mg, 0.1 mg, Oral, TID, Kalman ShanMurali Ramaswamy, MD, 0.1 mg at 07/20/16 1648 .  dexmedetomidine (PRECEDEX) 400 MCG/100ML (4 mcg/mL) infusion, 0.4-1.2 mcg/kg/hr, Intravenous, Titrated, Kalman ShanMurali Ramaswamy, MD, Last Rate: 6 mL/hr at 07/21/16 0745, 0.4 mcg/kg/hr at 07/21/16 0745 .  dextrose 5 %-0.45 % sodium chloride infusion, , Intravenous, Continuous, Kalman ShanMurali Ramaswamy, MD, Last Rate: 125 mL/hr at 07/21/16 0600 .  folic acid (FOLVITE) tablet 1 mg, 1 mg, Oral, Daily, Maye HidesAlexandria M Ukleja, MD .  heparin injection 5,000 Units, 5,000 Units, Subcutaneous, Q8H, Praveen Mannam, MD .  ibuprofen (ADVIL,MOTRIN) tablet 400 mg, 400 mg, Oral, Q6H PRN, Karl ItoSteven E Sommer, MD .  LORazepam (ATIVAN) injection 1-4 mg, 1-4 mg, Intravenous, Q4H PRN, Kalman ShanMurali Ramaswamy, MD, 2 mg at 07/21/16 0013 .  multivitamin with minerals tablet 1 tablet, 1 tablet, Oral, Daily, Lorretta HarpXilin Niu, MD .  ondansetron [facility](ZOFRAN) injection 4 mg, 4 mg, Intravenous, Q8H PRN, Lorretta HarpXilin Niu, MD .  polyethylene glycol (MIRALAX / GLYCOLAX) packet 17 g, 17 g, Oral, Daily PRN, Lorretta HarpXilin Niu, MD .  potassium chloride 10 mEq in 100 mL IVPB, 10 mEq, Intravenous, Q1 Hr x 4, Praveen Mannam, MD, 10 mEq at 07/21/16 0744 .   sodium chloride flush (NS) 0.9 % injection 3 mL, 3 mL, Intravenous, Q12H, Lorretta HarpXilin Niu, MD, 3 mL at 07/20/16 1311 .  thiamine (VITAMIN B-1) tablet 100 mg, 100 mg, Oral, Daily **OR** thiamine (B-1) injection 100 mg, 100 mg, Intravenous, Daily, Maye HidesAlexandria M Ukleja, MD, 100 mg at 07/20/16 1642  Objective:  Temp:  [97.3 F (36.3 C)-98.6 F (37 C)] 97.6 F (36.4 C) (10/23 0806) Pulse Rate:  [35-147] 57 (10/23 0700) Resp:  [12-22] 13 (10/23 0700) BP: (77-121)/(55-100) 84/67 (10/23 0700) SpO2:  [79 %-100 %] 100 % (10/23 0700) Weight:  [60.6 kg (133 lb 9.6 oz)-61.4 kg (135 lb 5.8 oz)] 61.4 kg (135 lb 5.8 oz) (10/23 0349)  General: WDWN Caucasian woman resting comfortably. She is initially asleep but rouses easily to voice. She is oriented to self, place, month, year. She initially reported that it was Sunday but corrected herself to Monday. She had difficulty giving the date. Speech is clear without dysarthria. Comportment is normal.  HEENT: Neck is supple without lymphadenopathy. Mucous membranes are dry and the oropharynx is clear. Sclerae are anicteric. There is no conjunctival injection.  CV: Regular, no murmur. Carotid pulses are 2+ and symmetric with no bruits. Distal pulses 2+ and symmetric.  Lungs: CTAB  Extremities: No C/C/E. Neuro: MS: As noted above. No aphasia.  CN: Pupils are equal and reactive from 3-->2 mm bilaterally. EOMI show some breakup of smooth pursuits but no nystagmus. Facial sensation is intact to light touch. Face is symmetric at rest with normal strength and mobility. Hearing is intact to  conversational voice. Voice is normal in tone and quality. Palate elevates symmetrically. Uvula is midline. Bilateral SCM and trapezii are 5/5. Tongue is midline with normal bulk and mobility.  Motor: Normal bulk, tone, and strength throughout. No pronator drift. No tremor or other abnormal movements are observed.  Sensation: Intact to light touch.  DTRs: Brisk 2+, symmetric. Toes are  downgoing bilaterally. No pathological reflexes.  Coordination: Finger-to-nose and heel-to-shin are without dysmetria bilaterally.    Labs: Lab Results  Component Value Date   WBC 4.5 07/21/2016   HGB 9.1 (L) 07/21/2016   HCT 26.5 (L) 07/21/2016   PLT 151 07/21/2016   GLUCOSE 138 (H) 07/21/2016   ALT 95 (H) 07/20/2016   AST 96 (H) 07/20/2016   NA 136 07/21/2016   K 3.7 07/21/2016   CL 107 07/21/2016   CREATININE 0.40 (L) 07/21/2016   BUN <5 (L) 07/21/2016   CO2 24 07/21/2016   INR 1.12 07/20/2016   CBC Latest Ref Rng & Units 07/21/2016 07/20/2016 07/20/2016  WBC 4.0 - 10.5 K/uL 4.5 8.0 -  Hemoglobin 12.0 - 15.0 g/dL 1.6(X) 0.9(U) 0.4(V)  Hematocrit 36.0 - 46.0 % 26.5(L) 26.6(L) 27.0(L)  Platelets 150 - 400 K/uL 151 142(L) -    No results found for: HGBA1C Lab Results  Component Value Date   ALT 95 (H) 07/20/2016   AST 96 (H) 07/20/2016   ALKPHOS 66 07/20/2016   BILITOT 1.1 07/20/2016    Radiology:  I have personally and independently reviewed the MRI scan of the brain without contrast from earlier today. This shows a pineal cyst. The brain is unremarkable. I have reviewed the radiologist's interpretation of the scan and my findings are in agreement with those reported by the radiologist.  A/P:   1. Seizure: Per history, she had a generalized seizure that was preceded by some disorientation and visual hallucinations. At this point, this is most likely due to alcohol withdrawal. MRI scan of the brain is unremarkable. EEG is pending today. I would continue to hold off on antiepileptic medications and treat alcohol withdrawal symptoms per protocol. If the EEG demonstrates epileptiform abnormalities that would suggest that she has high risk for recurrent seizures, the remaining T2 reconsider the role for AEDs. In the meantime, continue seizure precautions.  This was discussed with the patient. She is in agreement with the plan as noted. She was given an opportunity to ask any  questions and these were addressed to her satisfaction.    Rhona Leavens, MD [facility]   Neurology Addendum: EEG completed today and normal. No role for AEDs at this time.

## 2016-07-22 LAB — BASIC METABOLIC PANEL
ANION GAP: 7 (ref 5–15)
BUN: <5 mg/dL — ABNORMAL LOW (ref 6–20)
CALCIUM: 8.4 mg/dL — AB (ref 8.9–10.3)
CO2: 26 mmol/L (ref 22–32)
Chloride: 104 mmol/L (ref 101–111)
Creatinine, Ser: 0.48 mg/dL (ref 0.44–1.00)
GLUCOSE: 92 mg/dL (ref 65–99)
POTASSIUM: 3.5 mmol/L (ref 3.5–5.1)
Sodium: 137 mmol/L (ref 135–145)

## 2016-07-22 LAB — PHOSPHORUS: PHOSPHORUS: 4.6 mg/dL (ref 2.5–4.6)

## 2016-07-22 LAB — MAGNESIUM: Magnesium: 2 mg/dL (ref 1.7–2.4)

## 2016-07-22 LAB — CBC
HEMATOCRIT: 26.6 % — AB (ref 36.0–46.0)
Hemoglobin: 9.2 g/dL — ABNORMAL LOW (ref 12.0–15.0)
MCH: 35.4 pg — ABNORMAL HIGH (ref 26.0–34.0)
MCHC: 34.6 g/dL (ref 30.0–36.0)
MCV: 102.3 fL — AB (ref 78.0–100.0)
PLATELETS: 201 10*3/uL (ref 150–400)
RBC: 2.6 MIL/uL — AB (ref 3.87–5.11)
RDW: 13.3 % (ref 11.5–15.5)
WBC: 5.3 10*3/uL (ref 4.0–10.5)

## 2016-07-22 MED ORDER — FOLIC ACID 1 MG PO TABS: 1.0000 mg | ORAL_TABLET | Freq: Every day | ORAL | 0 refills | Status: DC

## 2016-07-22 MED ORDER — CLONIDINE HCL 0.1 MG PO TABS: ORAL_TABLET | ORAL | 0 refills | Status: DC

## 2016-07-22 MED ORDER — CLONIDINE HCL 0.1 MG PO TABS
0.1000 mg | ORAL_TABLET | Freq: Two times a day (BID) | ORAL | Status: DC
  Administered 2016-07-22: 0.1 mg via ORAL

## 2016-07-22 NOTE — Progress Notes (Addendum)
2:30pm CSW spoke with Merrily Pew at [facility]- they are awaiting medical approval before pt admit.  [facility] with follow up with pt directly regarding admission- no further CSW needs- pt agreeable to leaving and [facility]  [place] signing off  10:45am CSW consulted to help with pt referral to [facility] for ETOH rehab.  CSW met with pt and pt parents (parents aware of alcohol abuse and involved in care) at bedside.  Pt confirms desire to go to rehab and gave permission for CSW to call [facility] and fax requested clinicals to them.  CSW spoke with Gwyndolyn Saxon at [facility] and faxed information to admissions for review (fax: [phone])- Gwyndolyn Saxon believes they may have a bed but might require that the pt comes this evening to begin rehab.  Jorge Ny, LCSW Clinical Social Worker [phone]
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
## 2016-07-22 NOTE — Progress Notes (Signed)
Plan to d/c home later today.  Is no longer in withdrawal and has not required ativan.  Plan is for [facility] to go [facility].  Marlin CanaryJessica Marlyce Mcdougald DO
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
## 2016-07-22 NOTE — Progress Notes (Signed)
Patient and family provided with discharge instructions and verbalized understanding, IV's removed, two prescriptions given to patient, and all belongings accounted for.   Casper HarrisonSamantha K Nabil Bubolz, RN

## 2016-07-22 NOTE — Progress Notes (Signed)
Subjective: No further seizures overnight. Patient states that she feels comfortable.  Exam: Vitals:   07/21/16 2132 07/22/16 0503  BP: 114/70 122/68  Pulse: 91 87  Resp: 18 18  Temp: 98.3 F (36.8 C) 98.9 F (37.2 C)        Gen: In bed, NAD MS: Alert and oriented. Follows commands. CN: Face symmetric. EOMI, PERRLA, TML Motor: Moving all extremities well Sensory: Sensation grossly intact   Pertinent Labs/Diagnostics: EEG showed no epileptiform activity.  MRI showed no intracranial abnormalities.    Impression: 1. Seizure: Per history, she had a generalized seizure that was preceded by some disorientation and visual hallucinations. At this point, this is most likely due to alcohol withdrawal. MRI scan of the brain is unremarkable. EEG was within normal limits. I would continue to hold off on antiepileptic medications and treat alcohol withdrawal symptoms per protocol.   At this time would not start any antiepileptic medications. Neurology will sign off at this time please call with any questions.  Felicie MornDavid Jah Alarid PA-C [facility] [phone]   07/22/2016, 9:19 AM

## 2016-07-28 ENCOUNTER — Ambulatory Visit (INDEPENDENT_AMBULATORY_CARE_PROVIDER_SITE_OTHER): Payer: Self-pay

## 2016-07-28 ENCOUNTER — Encounter (INDEPENDENT_AMBULATORY_CARE_PROVIDER_SITE_OTHER): Payer: Self-pay | Admitting: Orthopedic Surgery

## 2016-07-28 ENCOUNTER — Ambulatory Visit (INDEPENDENT_AMBULATORY_CARE_PROVIDER_SITE_OTHER): Payer: PRIVATE HEALTH INSURANCE | Admitting: Orthopaedic Surgery

## 2016-07-28 VITALS — Ht 67.0 in | Wt 125.0 lb

## 2016-07-28 DIAGNOSIS — S42022A Displaced fracture of shaft of left clavicle, initial encounter for closed fracture: Secondary | ICD-10-CM | POA: Insufficient documentation

## 2016-07-28 DIAGNOSIS — M25512 Pain in left shoulder: Secondary | ICD-10-CM
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
## 2016-07-29 ENCOUNTER — Encounter (HOSPITAL_COMMUNITY): Payer: Self-pay | Admitting: *Deleted

## 2016-07-29 ENCOUNTER — Other Ambulatory Visit (INDEPENDENT_AMBULATORY_CARE_PROVIDER_SITE_OTHER): Payer: Self-pay | Admitting: Orthopaedic Surgery

## 2016-07-29 DIAGNOSIS — S42022A Displaced fracture of shaft of left clavicle, initial encounter for closed fracture: Secondary | ICD-10-CM

## 2016-07-29 DIAGNOSIS — S42002A Fracture of unspecified part of left clavicle, initial encounter for closed fracture: Secondary | ICD-10-CM

## 2016-07-29 NOTE — Progress Notes (Signed)
Spoke with pt and husband for pre-op call. Pt denies cardiac history, chest pain or sob. Last dose of Eliquis was Monday, 07/28/16 AM. Pt states Dilaudid or Morphine made her nauseated during chest tube insertion.

## 2016-07-30 ENCOUNTER — Ambulatory Visit (HOSPITAL_COMMUNITY): Payer: PRIVATE HEALTH INSURANCE

## 2016-07-30 ENCOUNTER — Ambulatory Visit (HOSPITAL_COMMUNITY): Payer: PRIVATE HEALTH INSURANCE | Admitting: Anesthesiology

## 2016-07-30 ENCOUNTER — Ambulatory Visit (HOSPITAL_COMMUNITY)
Admission: RE | Admit: 2016-07-30 | Discharge: 2016-07-30 | Disposition: A | Discharge status: Home / Self Care | Payer: PRIVATE HEALTH INSURANCE | Source: Ambulatory Visit | Attending: Orthopaedic Surgery | Admitting: Orthopaedic Surgery

## 2016-07-30 ENCOUNTER — Encounter (HOSPITAL_COMMUNITY): Payer: Self-pay | Admitting: Anesthesiology

## 2016-07-30 ENCOUNTER — Encounter (HOSPITAL_COMMUNITY)
Admission: RE | Disposition: A | Discharge status: Home / Self Care | Payer: Self-pay | Source: Ambulatory Visit | Attending: Orthopaedic Surgery

## 2016-07-30 DIAGNOSIS — Z7901 Long term (current) use of anticoagulants: Secondary | ICD-10-CM | POA: Insufficient documentation

## 2016-07-30 DIAGNOSIS — X58XXXA Exposure to other specified factors, initial encounter: Secondary | ICD-10-CM | POA: Insufficient documentation

## 2016-07-30 DIAGNOSIS — S42022A Displaced fracture of shaft of left clavicle, initial encounter for closed fracture: Secondary | ICD-10-CM

## 2016-07-30 DIAGNOSIS — F419 Anxiety disorder, unspecified: Secondary | ICD-10-CM | POA: Diagnosis not present

## 2016-07-30 DIAGNOSIS — Z7982 Long term (current) use of aspirin: Secondary | ICD-10-CM | POA: Diagnosis not present

## 2016-07-30 DIAGNOSIS — S42002A Fracture of unspecified part of left clavicle, initial encounter for closed fracture: Secondary | ICD-10-CM | POA: Diagnosis present

## 2016-07-30 DIAGNOSIS — I739 Peripheral vascular disease, unspecified: Secondary | ICD-10-CM | POA: Diagnosis not present

## 2016-07-30 DIAGNOSIS — Z419 Encounter for procedure for purposes other than remedying health state, unspecified: Secondary | ICD-10-CM

## 2016-07-30 HISTORY — DX: Family history of other specified conditions: Z84.89

## 2016-07-30 HISTORY — DX: Encephalopathy, unspecified: G93.40

## 2016-07-30 HISTORY — PX: ORIF CLAVICULAR FRACTURE: SHX5055

## 2016-07-30 HISTORY — DX: Anemia, unspecified: D64.9

## 2016-07-30 HISTORY — DX: Anxiety disorder, unspecified: F41.9

## 2016-07-30 HISTORY — DX: Fracture of unspecified part of unspecified clavicle, initial encounter for closed fracture: S42.009A

## 2016-07-30 HISTORY — DX: Unspecified injury of unspecified carotid artery, initial encounter: S15.009A

## 2016-07-30 HISTORY — DX: Unspecified convulsions: R56.9

## 2016-07-30 LAB — PROTIME-INR
INR: 1.1
PROTHROMBIN TIME: 14.2 s (ref 11.4–15.2)

## 2016-07-30 LAB — CBC
HCT: 37.4 % (ref 36.0–46.0)
Hemoglobin: 12.2 g/dL (ref 12.0–15.0)
MCH: 34.4 pg — AB (ref 26.0–34.0)
MCHC: 32.6 g/dL (ref 30.0–36.0)
MCV: 105.4 fL — AB (ref 78.0–100.0)
Platelets: 450 10*3/uL — ABNORMAL HIGH (ref 150–400)
RBC: 3.55 MIL/uL — AB (ref 3.87–5.11)
RDW: 13 % (ref 11.5–15.5)
WBC: 4.7 10*3/uL (ref 4.0–10.5)

## 2016-07-30 SURGERY — OPEN REDUCTION INTERNAL FIXATION (ORIF) CLAVICULAR FRACTURE
Anesthesia: General | Site: Shoulder | Laterality: Left

## 2016-07-30 MED ORDER — HYDROMORPHONE HCL 1 MG/ML IJ SOLN
0.2500 mg | INTRAMUSCULAR | Status: DC | PRN
  Administered 2016-07-30: 0.5 mg via INTRAVENOUS

## 2016-07-30 MED ORDER — MIDAZOLAM HCL 2 MG/2ML IJ SOLN
INTRAMUSCULAR | Status: AC
  Filled 2016-07-30: qty 2

## 2016-07-30 MED ORDER — ROCURONIUM BROMIDE 100 MG/10ML IV SOLN
INTRAVENOUS | Status: DC | PRN
  Administered 2016-07-30: 60 mg via INTRAVENOUS
  Administered 2016-07-30: 20 mg via INTRAVENOUS

## 2016-07-30 MED ORDER — FENTANYL CITRATE (PF) 100 MCG/2ML IJ SOLN
INTRAMUSCULAR | Status: AC
  Filled 2016-07-30: qty 2

## 2016-07-30 MED ORDER — DEXAMETHASONE SODIUM PHOSPHATE 10 MG/ML IJ SOLN
INTRAMUSCULAR | Status: DC | PRN
  Administered 2016-07-30: 10 mg via INTRAVENOUS

## 2016-07-30 MED ORDER — PROPOFOL 10 MG/ML IV BOLUS
INTRAVENOUS | Status: AC
  Filled 2016-07-30: qty 20

## 2016-07-30 MED ORDER — CEFAZOLIN SODIUM-DEXTROSE 2-4 GM/100ML-% IV SOLN
INTRAVENOUS | Status: AC
  Filled 2016-07-30: qty 100

## 2016-07-30 MED ORDER — SUGAMMADEX SODIUM 200 MG/2ML IV SOLN
INTRAVENOUS | Status: DC | PRN
  Administered 2016-07-30: 100 mg via INTRAVENOUS

## 2016-07-30 MED ORDER — DEXAMETHASONE SODIUM PHOSPHATE 10 MG/ML IJ SOLN
INTRAMUSCULAR | Status: AC
  Filled 2016-07-30: qty 2

## 2016-07-30 MED ORDER — 0.9 % SODIUM CHLORIDE (POUR BTL) OPTIME
TOPICAL | Status: DC | PRN
  Administered 2016-07-30: 1000 mL

## 2016-07-30 MED ORDER — BUPIVACAINE HCL (PF) 0.5 % IJ SOLN
INTRAMUSCULAR | Status: AC
  Filled 2016-07-30: qty 30

## 2016-07-30 MED ORDER — PROPOFOL 10 MG/ML IV BOLUS
INTRAVENOUS | Status: DC | PRN
  Administered 2016-07-30: 140 mg via INTRAVENOUS

## 2016-07-30 MED ORDER — SENNOSIDES-DOCUSATE SODIUM 8.6-50 MG PO TABS: 1.0000 | ORAL_TABLET | Freq: Every evening | ORAL | 1 refills | Status: AC | PRN

## 2016-07-30 MED ORDER — LACTATED RINGERS IV SOLN
INTRAVENOUS | Status: DC
Start: 2016-07-30 — End: 2016-07-30
  Administered 2016-07-30: 13:00:00 via INTRAVENOUS
  Administered 2016-07-30: 50 mL/h via INTRAVENOUS

## 2016-07-30 MED ORDER — ONDANSETRON HCL 4 MG/2ML IJ SOLN
INTRAMUSCULAR | Status: AC
  Filled 2016-07-30: qty 2

## 2016-07-30 MED ORDER — TRAMADOL HCL 50 MG PO TABS: 50.0000 mg | ORAL_TABLET | Freq: Four times a day (QID) | ORAL | 0 refills | Status: DC | PRN

## 2016-07-30 MED ORDER — FENTANYL CITRATE (PF) 100 MCG/2ML IJ SOLN
INTRAMUSCULAR | Status: DC | PRN
  Administered 2016-07-30 (×2): 50 ug via INTRAVENOUS
  Administered 2016-07-30 (×2): 100 ug via INTRAVENOUS

## 2016-07-30 MED ORDER — MEPERIDINE HCL 25 MG/ML IJ SOLN: 6.2500 mg | INTRAMUSCULAR | Status: DC | PRN

## 2016-07-30 MED ORDER — OXYCODONE HCL 5 MG PO TABS: 5.0000 mg | ORAL_TABLET | ORAL | 0 refills | Status: DC | PRN

## 2016-07-30 MED ORDER — PROMETHAZINE HCL 25 MG PO TABS: 25.0000 mg | ORAL_TABLET | Freq: Four times a day (QID) | ORAL | 1 refills | Status: DC | PRN

## 2016-07-30 MED ORDER — HYDROMORPHONE HCL 1 MG/ML IJ SOLN
INTRAMUSCULAR | Status: AC
  Filled 2016-07-30: qty 0.5

## 2016-07-30 MED ORDER — METHOCARBAMOL 500 MG PO TABS: 500.0000 mg | ORAL_TABLET | Freq: Four times a day (QID) | ORAL | 2 refills | Status: DC | PRN

## 2016-07-30 MED ORDER — PROMETHAZINE HCL 25 MG/ML IJ SOLN: 6.2500 mg | INTRAMUSCULAR | Status: DC | PRN

## 2016-07-30 MED ORDER — BUPIVACAINE HCL (PF) 0.5 % IJ SOLN
INTRAMUSCULAR | Status: DC | PRN
  Administered 2016-07-30: 10 mL

## 2016-07-30 MED ORDER — SODIUM CHLORIDE 0.9 % IR SOLN
Status: DC | PRN
  Administered 2016-07-30: 3000 mL

## 2016-07-30 MED ORDER — LACTATED RINGERS IV SOLN: INTRAVENOUS | Status: DC

## 2016-07-30 MED ORDER — ONDANSETRON HCL 4 MG/2ML IJ SOLN
INTRAMUSCULAR | Status: DC | PRN
  Administered 2016-07-30: 4 mg via INTRAVENOUS

## 2016-07-30 MED ORDER — CEFAZOLIN SODIUM-DEXTROSE 2-4 GM/100ML-% IV SOLN
2.0000 g | INTRAVENOUS | Status: AC
  Administered 2016-07-30: 2 g via INTRAVENOUS

## 2016-07-30 MED ORDER — LIDOCAINE HCL (CARDIAC) 20 MG/ML IV SOLN
INTRAVENOUS | Status: DC | PRN
  Administered 2016-07-30: 60 mg via INTRAVENOUS

## 2016-07-30 MED ORDER — MIDAZOLAM HCL 5 MG/5ML IJ SOLN
INTRAMUSCULAR | Status: DC | PRN
  Administered 2016-07-30: 2 mg via INTRAVENOUS

## 2016-07-30 SURGICAL SUPPLY — 53 items
CLSR STERI-STRIP ANTIMIC 1/2X4 (GAUZE/BANDAGES/DRESSINGS) ×1 IMPLANT
COVER SURGICAL LIGHT HANDLE (MISCELLANEOUS) ×2 IMPLANT
DRAPE C-ARM 42X72 X-RAY (DRAPES) ×2 IMPLANT
DRAPE IMP U-DRAPE 54X76 (DRAPES) ×2 IMPLANT
DRAPE INCISE IOBAN 66X45 STRL (DRAPES) ×1 IMPLANT
DRAPE SURG 17X23 STRL (DRAPES) ×3 IMPLANT
DRAPE U-SHAPE 47X51 STRL (DRAPES) ×3 IMPLANT
DRILL 2.6X220MM LONG AO (BIT) ×1 IMPLANT
DRSG TEGADERM 4X4.75 (GAUZE/BANDAGES/DRESSINGS) ×4 IMPLANT
DURAPREP 26ML APPLICATOR (WOUND CARE) ×2 IMPLANT
ELECT BLADE 4.0 EZ CLEAN MEGAD (MISCELLANEOUS) ×2
ELECT CAUTERY BLADE 6.4 (BLADE) ×2 IMPLANT
ELECT REM PT RETURN 9FT ADLT (ELECTROSURGICAL) ×2
ELECTRODE BLDE 4.0 EZ CLN MEGD (MISCELLANEOUS) ×1 IMPLANT
ELECTRODE REM PT RTRN 9FT ADLT (ELECTROSURGICAL) ×1 IMPLANT
GAUZE SPONGE 4X4 12PLY STRL (GAUZE/BANDAGES/DRESSINGS) ×1 IMPLANT
GAUZE XEROFORM 1X8 LF (GAUZE/BANDAGES/DRESSINGS) ×2 IMPLANT
GLOVE ECLIPSE 6.5 STRL STRAW (GLOVE) ×1 IMPLANT
GLOVE SKINSENSE NS SZ7.5 (GLOVE) ×2
GLOVE SKINSENSE STRL SZ7.5 (GLOVE) ×2 IMPLANT
GOWN STRL REIN XL XLG (GOWN DISPOSABLE) ×5 IMPLANT
K-WIRE OLIVE STOP (WIRE) ×4
KIT BASIN OR (CUSTOM PROCEDURE TRAY) ×2 IMPLANT
KIT ROOM TURNOVER OR (KITS) ×2 IMPLANT
KWIRE OLIVE STOP (WIRE) IMPLANT
MANIFOLD NEPTUNE II (INSTRUMENTS) ×2 IMPLANT
NDL HYPO 25GX1X1/2 BEV (NEEDLE) IMPLANT
NEEDLE 22X1 1/2 (OR ONLY) (NEEDLE) ×1 IMPLANT
NEEDLE HYPO 25GX1X1/2 BEV (NEEDLE) ×2 IMPLANT
NS IRRIG 1000ML POUR BTL (IV SOLUTION) ×2 IMPLANT
PACK SHOULDER (CUSTOM PROCEDURE TRAY) ×2 IMPLANT
PAD ARMBOARD 7.5X6 YLW CONV (MISCELLANEOUS) ×3 IMPLANT
PLATE BONE 6HOLE 59MM (Plate) ×1 IMPLANT
SCREW BONE 3.5X12 (Screw) ×3 IMPLANT
SCREW BONE 3.5X14MM (Screw) ×1 IMPLANT
SCREW BONE 3.5X16MM (Screw) ×2 IMPLANT
SET CYSTO W/LG BORE CLAMP LF (SET/KITS/TRAYS/PACK) ×1 IMPLANT
SLING ARM FOAM STRAP MED (SOFTGOODS) ×1 IMPLANT
SPONGE LAP 18X18 X RAY DECT (DISPOSABLE) ×4 IMPLANT
STRIP CLOSURE SKIN 1/2X4 (GAUZE/BANDAGES/DRESSINGS) ×2 IMPLANT
SUCTION FRAZIER HANDLE 10FR (MISCELLANEOUS) ×1
SUCTION TUBE FRAZIER 10FR DISP (MISCELLANEOUS) ×1 IMPLANT
SUT MNCRL AB 4-0 PS2 18 (SUTURE) ×2 IMPLANT
SUT VIC AB 0 CT1 27 (SUTURE) ×2
SUT VIC AB 0 CT1 27XBRD ANBCTR (SUTURE) IMPLANT
SUT VIC AB 2-0 CT1 27 (SUTURE) ×2
SUT VIC AB 2-0 CT1 TAPERPNT 27 (SUTURE) ×1 IMPLANT
SUT VIC AB 2-0 FS1 27 (SUTURE) ×1 IMPLANT
SUT VIC AB 3-0 SH 27 (SUTURE) ×2
SUT VIC AB 3-0 SH 27X BRD (SUTURE) IMPLANT
SUT VICRYL 0 CT 1 36IN (SUTURE) ×1 IMPLANT
SYR CONTROL 10ML LL (SYRINGE) ×1 IMPLANT
WATER STERILE IRR 1000ML POUR (IV SOLUTION) ×2 IMPLANT
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
## 2016-07-30 NOTE — Transfer of Care (Signed)
Immediate Anesthesia Transfer of Care Note  Patient: [name] [name]  Procedure(s) Performed: Procedure(s): OPEN REDUCTION INTERNAL FIXATION (ORIF) LEFT CLAVICLE FRACTURE (Left)  Patient Location: PACU  Anesthesia Type:General  Level of Consciousness: awake, alert , oriented and patient cooperative  Airway & Oxygen Therapy: Patient Spontanous Breathing and Patient connected to nasal cannula oxygen  Post-op Assessment: Report given to RN, Post -op Vital signs reviewed and stable and Patient moving all extremities X 4  Post vital signs: Reviewed and stable  Last Vitals:  Vitals:   07/30/16 0907 07/30/16 1340  BP: 104/61   Pulse: 72   Resp: 18   Temp: 36.8 C 36.6 C    Last Pain:  Vitals:   07/30/16 0907  PainSc: 5       Patients Stated Pain Goal: 2 (07/30/16 0907)  Complications: No apparent anesthesia complications

## 2016-07-30 NOTE — Anesthesia Postprocedure Evaluation (Signed)
Anesthesia Post Note  Patient: [name] MerlCasey H [name]  Procedure(s) Performed: Procedure(s) (LRB): OPEN REDUCTION INTERNAL FIXATION (ORIF) LEFT CLAVICLE FRACTURE (Left)  Patient location during evaluation: PACU Anesthesia Type: General Level of consciousness: awake and alert Pain management: pain level controlled Vital Signs Assessment: post-procedure vital signs reviewed and stable Respiratory status: spontaneous breathing, nonlabored ventilation, respiratory function stable and patient connected to nasal cannula oxygen Cardiovascular status: blood pressure returned to baseline and stable Postop Assessment: no signs of nausea or vomiting Anesthetic complications: no    Last Vitals:  Vitals:   07/30/16 1410 07/30/16 1414  BP: 115/80   Pulse: 79   Resp: 16   Temp:  36.4 C    Last Pain:  Vitals:   07/30/16 1414  PainSc: 4                  Shelton SilvasKevin D Ziah Turvey

## 2016-07-30 NOTE — Discharge Instructions (Signed)
Postoperative instructions:  Weightbearing: non weight bearing, may lift arm up to level of shoulder  Keep your dressing and/or splint clean and dry at all times.  You can remove your dressing on post-operative day #3 and change with a dry/sterile dressing or Band-Aids as needed thereafter.    Incision instructions:  Do not soak your incision for 3 weeks after surgery.  If the incision gets wet, pat dry and do not scrub the incision.  Pain control:  You have been given a prescription to be taken as directed for post-operative pain control.  In addition, elevate the operative extremity above the heart at all times to prevent swelling and throbbing pain.  Take over-the-counter Colace, 100mg  by mouth twice a day while taking narcotic pain medications to help prevent constipation.  Follow up appointments: 1) 10-14 days for suture removal and wound check. 2) Dr. Roda ShuttersXu as scheduled.   -------------------------------------------------------------------------------------------------------------  After Surgery Pain Control:  After your surgery, post-surgical discomfort or pain is likely. This discomfort can last several days to a few weeks. At certain times of the day your discomfort may be more intense.  Did you receive a nerve block?  A nerve block can provide pain relief for one hour to two days after your surgery. As long as the nerve block is working, you will experience little or no sensation in the area the surgeon operated on.  As the nerve block wears off, you will begin to experience pain or discomfort. It is very important that you begin taking your prescribed pain medication before the nerve block fully wears off. Treating your pain at the first sign of the block wearing off will ensure your pain is better controlled and more tolerable when full-sensation returns. Do not wait until the pain is intolerable, as the medicine will be less effective. It is better to treat pain in advance than to  try and catch up.  General Anesthesia:  If you did not receive a nerve block during your surgery, you will need to start taking your pain medication shortly after your surgery and should continue to do so as prescribed by your surgeon.  Pain Medication:  Most commonly we prescribe Vicodin and Percocet for post-operative pain. Both of these medications contain a combination of acetaminophen (Tylenol) and a narcotic to help control pain.   It takes between 30 and 45 minutes before pain medication starts to work. It is important to take your medication before your pain level gets too intense.   Nausea is a common side effect of many pain medications. You will want to eat something before taking your pain medicine to help prevent nausea.   If you are taking a prescription pain medication that contains acetaminophen, we recommend that you do not take additional over the counter acetaminophen (Tylenol).  Other pain relieving options:   Using a cold pack to ice the affected area a few times a day (15 to 20 minutes at a time) can help to relieve pain, reduce swelling and bruising.   Elevation of the affected area can also help to reduce pain and swelling.

## 2016-07-30 NOTE — Anesthesia Preprocedure Evaluation (Addendum)
Anesthesia Evaluation  Patient identified by MRN, date of birth, ID band Patient awake    Reviewed: Allergy & Precautions, NPO status , Patient's Chart, lab work & pertinent test results  Airway Mallampati: I  TM Distance: >3 FB Neck ROM: Full    Dental  (+) Teeth Intact, Dental Advisory Given   Pulmonary neg pulmonary ROS,    breath sounds clear to auscultation       Cardiovascular + Peripheral Vascular Disease   Rhythm:Regular Rate:Normal     Neuro/Psych Seizures -,  PSYCHIATRIC DISORDERS Anxiety    GI/Hepatic negative GI ROS, Neg liver ROS,   Endo/Other  negative endocrine ROS  Renal/GU negative Renal ROS  negative genitourinary   Musculoskeletal negative musculoskeletal ROS (+)   Abdominal   Peds negative pediatric ROS (+)  Hematology negative hematology ROS (+)   Anesthesia Other Findings   Reproductive/Obstetrics negative OB ROS                            Lab Results  Component Value Date   WBC 4.7 07/30/2016   HGB 12.2 07/30/2016   HCT 37.4 07/30/2016   MCV 105.4 (H) 07/30/2016   PLT 450 (H) 07/30/2016   Lab Results  Component Value Date   CREATININE 0.48 07/22/2016   BUN <5 (L) 07/22/2016   NA 137 07/22/2016   K 3.5 07/22/2016   CL 104 07/22/2016   CO2 26 07/22/2016   Lab Results  Component Value Date   INR 1.10 07/30/2016   INR 1.12 07/20/2016   INR 1.04 07/16/2016    06/2016 EKG: atrial fibrillation.   Anesthesia Physical Anesthesia Plan  ASA: II  Anesthesia Plan: General   Post-op Pain Management:    Induction: Intravenous  Airway Management Planned: Oral ETT  Additional Equipment:   Intra-op Plan:   Post-operative Plan: Extubation in OR  Informed Consent: I have reviewed the patients History and Physical, chart, labs and discussed the procedure including the risks, benefits and alternatives for the proposed anesthesia with the patient or  authorized representative who has indicated his/her understanding and acceptance.   Dental advisory given  Plan Discussed with: CRNA  Anesthesia Plan Comments:         Anesthesia Quick Evaluation

## 2016-07-30 NOTE — Anesthesia Procedure Notes (Signed)
Procedure Name: Intubation Date/Time: 07/30/2016 12:02 PM Performed by: Adonis HousekeeperNGELL, Frank Novelo M Pre-anesthesia Checklist: Patient identified, Emergency Drugs available, Suction available and Patient being monitored Patient Re-evaluated:Patient Re-evaluated prior to inductionOxygen Delivery Method: Circle system utilized Preoxygenation: Pre-oxygenation with 100% oxygen Intubation Type: IV induction Ventilation: Mask ventilation without difficulty Laryngoscope Size: Mac and 3 Grade View: Grade I Tube type: Oral Tube size: 7.0 mm Number of attempts: 1 Airway Equipment and Method: Stylet Placement Confirmation: ETT inserted through vocal cords under direct vision,  positive ETCO2 and breath sounds checked- equal and bilateral Secured at: 22 cm Tube secured with: Tape Dental Injury: Teeth and Oropharynx as per pre-operative assessment
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
## 2016-07-31 ENCOUNTER — Telehealth (INDEPENDENT_AMBULATORY_CARE_PROVIDER_SITE_OTHER): Payer: Self-pay | Admitting: Orthopaedic Surgery

## 2016-07-31 NOTE — Telephone Encounter (Signed)
Patient called needing to know if the FMLA papers are ready to be picked up.  The number to contact her is [phone]

## 2016-08-01 NOTE — Telephone Encounter (Signed)
Okay to fill FMLA for husband? Called patient and she sates to fill out husband FMLA just in case he needs to be out to care for her. Is this okay?

## 2016-08-01 NOTE — Telephone Encounter (Signed)
Patient called asked if her FMLA  Papers are ready to be picked up. She also said the papers can be faxed instead of her picking them up... [facility]. The papers were for both her and her husband Haynes HoehnMyron Alers  The fax# [phone]   The number to contact patient is [phone]

## 2016-08-04 ENCOUNTER — Encounter (HOSPITAL_COMMUNITY): Payer: Self-pay | Admitting: Orthopaedic Surgery

## 2016-08-04 NOTE — Telephone Encounter (Signed)
I will fill out husband as well.

## 2016-08-04 NOTE — Telephone Encounter (Signed)
yes

## 2016-08-06 NOTE — Telephone Encounter (Signed)
I filled out both forms and they were faxed to [phone] with notes. Advised pt

## 2016-08-14 ENCOUNTER — Inpatient Hospital Stay (INDEPENDENT_AMBULATORY_CARE_PROVIDER_SITE_OTHER): Payer: PRIVATE HEALTH INSURANCE | Admitting: Orthopaedic Surgery

## 2016-08-15 ENCOUNTER — Ambulatory Visit (INDEPENDENT_AMBULATORY_CARE_PROVIDER_SITE_OTHER): Payer: PRIVATE HEALTH INSURANCE | Admitting: Orthopaedic Surgery

## 2016-08-15 ENCOUNTER — Encounter (INDEPENDENT_AMBULATORY_CARE_PROVIDER_SITE_OTHER): Payer: Self-pay | Admitting: Orthopaedic Surgery

## 2016-08-15 ENCOUNTER — Ambulatory Visit (INDEPENDENT_AMBULATORY_CARE_PROVIDER_SITE_OTHER): Payer: PRIVATE HEALTH INSURANCE

## 2016-08-15 ENCOUNTER — Telehealth (INDEPENDENT_AMBULATORY_CARE_PROVIDER_SITE_OTHER): Payer: Self-pay | Admitting: Orthopaedic Surgery

## 2016-08-15 DIAGNOSIS — S42022D Displaced fracture of shaft of left clavicle, subsequent encounter for fracture with routine healing: Secondary | ICD-10-CM
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
## 2016-08-15 NOTE — Telephone Encounter (Signed)
This number pt left was a fax number not a phone number. Faxed updated work note to [phone]

## 2016-08-15 NOTE — Telephone Encounter (Signed)
UPDATED PHONE # FOR FMLA FORMS  #[phone]    PLEASE CALL PATIENT IF YOU NEED ANY THING FROM HER JOB, SHE IS UNDER THE IMPRESSION THAT YOU HAVE EVERYTHING YOU NEED TO UPDATE HER FMLA.

## 2016-08-20 ENCOUNTER — Telehealth (INDEPENDENT_AMBULATORY_CARE_PROVIDER_SITE_OTHER): Payer: Self-pay

## 2016-08-20 NOTE — Telephone Encounter (Signed)
Faxed disability forms with all office notes today

## 2016-09-11 ENCOUNTER — Encounter: Payer: Self-pay | Admitting: Vascular Surgery

## 2016-09-12 ENCOUNTER — Encounter (INDEPENDENT_AMBULATORY_CARE_PROVIDER_SITE_OTHER): Payer: Self-pay | Admitting: Orthopaedic Surgery

## 2016-09-12 ENCOUNTER — Ambulatory Visit (INDEPENDENT_AMBULATORY_CARE_PROVIDER_SITE_OTHER): Payer: PRIVATE HEALTH INSURANCE | Admitting: Orthopaedic Surgery

## 2016-09-12 ENCOUNTER — Ambulatory Visit (INDEPENDENT_AMBULATORY_CARE_PROVIDER_SITE_OTHER): Payer: PRIVATE HEALTH INSURANCE

## 2016-09-12 ENCOUNTER — Encounter (INDEPENDENT_AMBULATORY_CARE_PROVIDER_SITE_OTHER): Payer: Self-pay

## 2016-09-12 DIAGNOSIS — S42022D Displaced fracture of shaft of left clavicle, subsequent encounter for fracture with routine healing: Secondary | ICD-10-CM
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
## 2016-09-15 ENCOUNTER — Ambulatory Visit
Admit: 2016-09-15 | Discharge: 2016-09-15 | Disposition: A | Discharge status: Home / Self Care | Payer: PRIVATE HEALTH INSURANCE | Attending: Vascular Surgery | Admitting: Vascular Surgery

## 2016-09-15 DIAGNOSIS — I7771 Dissection of carotid artery: Secondary | ICD-10-CM

## 2016-09-17 ENCOUNTER — Encounter: Payer: Self-pay | Admitting: Vascular Surgery

## 2016-09-17 ENCOUNTER — Ambulatory Visit (INDEPENDENT_AMBULATORY_CARE_PROVIDER_SITE_OTHER): Payer: PRIVATE HEALTH INSURANCE | Admitting: Vascular Surgery

## 2016-09-17 ENCOUNTER — Ambulatory Visit (HOSPITAL_COMMUNITY)
Admission: RE | Admit: 2016-09-17 | Discharge: 2016-09-17 | Disposition: A | Discharge status: Home / Self Care | Payer: PRIVATE HEALTH INSURANCE | Source: Ambulatory Visit | Attending: Vascular Surgery | Admitting: Vascular Surgery

## 2016-09-17 VITALS — BP 94/63 | HR 67 | Temp 97.3°F | Resp 16 | Ht 67.0 in | Wt 121.0 lb

## 2016-09-17 DIAGNOSIS — I7774 Dissection of vertebral artery: Secondary | ICD-10-CM

## 2016-09-17 DIAGNOSIS — I6522 Occlusion and stenosis of left carotid artery: Secondary | ICD-10-CM
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
## 2016-09-17 NOTE — Addendum Note (Signed)
Addended by: Burton ApleyPETTY, Keveon Amsler A on: 09/17/2016 03:56 PM   Modules accepted: Orders

## 2016-09-29 NOTE — L&D Delivery Note (Signed)
Delivery Note At 1:10 PM a viable female was delivered via Vaginal, Spontaneous Delivery (Presentation: vtx; LOA ).  APGAR: 9, 9; weight pending.   Placenta status: spontaneous, intact.  Cord:  with the following complications: .none   Anesthesia:  Epidural Episiotomy: None Lacerations: Periurethral Suture Repair: none Est. Blood Loss (mL): 200  Mom to postpartum.  Baby to Couplet care / Skin to Skin.  Will start back on eliquis tomorrow  Leighton Roachodd D Mamoudou Mulvehill 04/17/2017, 1:28 PM

## 2016-10-13 LAB — OB RESULTS CONSOLE GC/CHLAMYDIA
CHLAMYDIA, DNA PROBE: NEGATIVE
GC PROBE AMP, GENITAL: NEGATIVE

## 2016-10-13 LAB — OB RESULTS CONSOLE ABO/RH: RH Type: POSITIVE

## 2016-10-13 LAB — OB RESULTS CONSOLE ANTIBODY SCREEN: Antibody Screen: NEGATIVE

## 2016-10-13 LAB — OB RESULTS CONSOLE HIV ANTIBODY (ROUTINE TESTING): HIV: NONREACTIVE

## 2016-10-13 LAB — OB RESULTS CONSOLE RPR: RPR: NONREACTIVE

## 2016-10-13 LAB — OB RESULTS CONSOLE RUBELLA ANTIBODY, IGM: RUBELLA: IMMUNE

## 2016-10-13 LAB — OB RESULTS CONSOLE HEPATITIS B SURFACE ANTIGEN: Hepatitis B Surface Ag: NEGATIVE

## 2016-10-23 ENCOUNTER — Ambulatory Visit (INDEPENDENT_AMBULATORY_CARE_PROVIDER_SITE_OTHER): Payer: PRIVATE HEALTH INSURANCE | Admitting: Orthopaedic Surgery

## 2016-10-23 ENCOUNTER — Encounter (INDEPENDENT_AMBULATORY_CARE_PROVIDER_SITE_OTHER): Payer: Self-pay | Admitting: Orthopaedic Surgery

## 2016-10-23 DIAGNOSIS — S42022D Displaced fracture of shaft of left clavicle, subsequent encounter for fracture with routine healing: Secondary | ICD-10-CM
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
## 2016-10-24 ENCOUNTER — Ambulatory Visit (INDEPENDENT_AMBULATORY_CARE_PROVIDER_SITE_OTHER): Payer: PRIVATE HEALTH INSURANCE | Admitting: Orthopaedic Surgery

## 2017-03-13 ENCOUNTER — Encounter: Payer: Self-pay | Admitting: Family

## 2017-03-26 ENCOUNTER — Encounter: Payer: Self-pay | Admitting: Family

## 2017-03-26 ENCOUNTER — Ambulatory Visit (HOSPITAL_COMMUNITY)
Admission: RE | Admit: 2017-03-26 | Discharge: 2017-03-26 | Disposition: A | Discharge status: Home / Self Care | Payer: PRIVATE HEALTH INSURANCE | Source: Ambulatory Visit | Attending: Vascular Surgery | Admitting: Vascular Surgery

## 2017-03-26 ENCOUNTER — Ambulatory Visit (INDEPENDENT_AMBULATORY_CARE_PROVIDER_SITE_OTHER): Payer: PRIVATE HEALTH INSURANCE | Admitting: Family

## 2017-03-26 VITALS — BP 94/58 | HR 81 | Temp 98.3°F | Resp 16 | Ht 67.0 in | Wt 134.0 lb

## 2017-03-26 DIAGNOSIS — I7771 Dissection of carotid artery: Secondary | ICD-10-CM

## 2017-03-26 DIAGNOSIS — I7774 Dissection of vertebral artery: Secondary | ICD-10-CM

## 2017-03-26 DIAGNOSIS — I6523 Occlusion and stenosis of bilateral carotid arteries: Secondary | ICD-10-CM | POA: Insufficient documentation

## 2017-03-26 DIAGNOSIS — I6522 Occlusion and stenosis of left carotid artery: Secondary | ICD-10-CM | POA: Diagnosis not present

## 2017-03-26 LAB — VAS US CAROTID
LCCADSYS: 128 cm/s
LEFT ECA DIAS: -13 cm/s
LEFT VERTEBRAL DIAS: -16 cm/s
LICADSYS: -79 cm/s
Left CCA dist dias: 31 cm/s
Left CCA prox dias: 23 cm/s
Left CCA prox sys: 137 cm/s
Left ICA dist dias: -24 cm/s
Left ICA prox dias: -22 cm/s
Left ICA prox sys: -82 cm/s
RCCAPDIAS: 17 cm/s
RIGHT CCA MID DIAS: 24 cm/s
RIGHT ECA DIAS: -11 cm/s
Right CCA prox sys: 123 cm/s
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
## 2017-03-26 NOTE — Patient Instructions (Signed)
Vertebral Artery Dissection  Vertebral artery dissection is a tear in a vertebral artery. The vertebral arteries are major blood vessels at the base of the neck. They carry blood from the heart to the brain. When an artery tears, blood collects inside the layers of the artery wall. This can cause a blood clot.  This condition increases the risk of stroke if it is not diagnosed and treated right away. It is a common cause of stroke in people who are 30-45 years old.  What are the causes?  This condition may be caused by:  · A neck injury due to sudden or excessive neck movement. The injury may be mild or severe.  · Having weak blood vessel walls. The walls may tear even when no injury occurs (spontaneous dissection).    What increases the risk?  You may be more likely to have a spontaneous vertebral artery dissection if you:  · Have high blood pressure.  · Have a migraine disorder.  · Have certain inherited diseases or connective tissue disorders that weaken the blood vessels.  · Have fibromuscular dysplasia.    What are the signs or symptoms?  Symptoms usually appear within days of an injury, but sometimes they may not appear for weeks or years. Symptoms may include:  · Stabbing, sharp pain in the head, neck, eye, or face.  · Vertigo. This is a feeling that you or your surroundings are moving when they are not.  · Dizziness.  · Double vision.  · Difficulty speaking.  · Difficulty swallowing.  · Hoarse voice.  · Loss of feeling in the torso, legs, or arms.  · Loss of taste.  · Loss of balance.  · Hiccups.  · Nausea and vomiting.  · Hearing loss.  · Ear pain.    How is this diagnosed?  This condition is diagnosed with tests, such as:  · CT angiogram. This test uses a computer to take X-rays of your vertebral arteries. A dye may be injected into your blood to show the inside of your blood vessels more clearly.  · MRI angiogram. This is a type of MRI that is used to evaluate the blood vessels.  · Cerebral angiogram.  This test uses X-rays and a dye to show the blood vessels in the brain and neck.  · Doppler ultrasound. This test uses sound waves to show the vertebral arteries. The test will also show how well blood flows through your arteries.    How is this treated?  Treatment for this condition depends on the cause of your vertebral artery dissection and your overall health. The most important goal of treatment is to prevent a stroke. If you are having a stroke, it is important to get treatment quickly. Treatment may include:  · Blood thinners. This medicine helps to prevent blood clots. This may be given first through an IV tube, and then as pills for 3-6 months.  · Procedures to widen a narrow blood vessel (angioplasty) or to place a mesh tube (stent) inside the blood vessel to keep it open.  · Surgery to repair the area. This is rarely needed.    Follow these instructions at home:  · Work with your health care provider to control your blood pressure. This may involve:  ? Exercising regularly, as told by your health care provider. Check with your health care provider before starting any new type of exercise.  ? Eating a heart-healthy diet. This includes limiting unhealthy fats and eating more healthy   fats.  ? Reducing the amount of salt (sodium) that you eat to less than 1,500 mg a day.  ? Reducing stress by participating in things that you enjoy and avoiding things that cause you stress.  · Avoid activities that put you at risk for neck injuries, such as contact sports.  · Take over-the-counter and prescription medicines only as told by your health care provider.  · Do not use any tobacco products, such as cigarettes, chewing tobacco, and e-cigarettes. If you need help quitting, ask your health care provider.  · Keep all follow-up visits as told by your health care provider. This is important.  Get help right away if:  · You feel weak or dizzy.  · You have a sudden, severe headache with no known cause.  · You notice changes  in your vision or speech.  · You have difficulty breathing.  · You have a loss of balance or coordination.  · You have numbness in your face, arm, or leg.  · You have chest pain.  · You have a fever.  These symptoms may represent a serious problem that is an emergency. Do not wait to see if the symptoms will go away. Get medical help right away. Call your local emergency services (911 in the [place] Do not drive yourself to the hospital.  This information is not intended to replace advice given to you by your health care provider. Make sure you discuss any questions you have with your health care provider.  Document Released: 09/01/2012 Document Revised: 02/26/2016 Document Reviewed: 12/12/2015  Elsevier Interactive Patient Education © 2018 [employer]

## 2017-03-31 LAB — OB RESULTS CONSOLE GBS: STREP GROUP B AG: NEGATIVE

## 2017-04-08 NOTE — Addendum Note (Signed)
Addended by: Burton ApleyPETTY, Yareth Kearse A on: 04/08/2017 10:41 AM   Modules accepted: Orders

## 2017-04-09 ENCOUNTER — Encounter (HOSPITAL_COMMUNITY): Payer: Self-pay | Admitting: *Deleted

## 2017-04-09 ENCOUNTER — Telehealth (HOSPITAL_COMMUNITY): Payer: Self-pay | Admitting: *Deleted

## 2017-04-09 NOTE — Telephone Encounter (Signed)
Preadmission screen  

## 2017-04-10 ENCOUNTER — Encounter (HOSPITAL_COMMUNITY): Payer: Self-pay | Admitting: *Deleted

## 2017-04-17 ENCOUNTER — Inpatient Hospital Stay (HOSPITAL_COMMUNITY)
Admission: RE | Admit: 2017-04-17 | Discharge: 2017-04-18 | DRG: 775 | Disposition: A | Discharge status: Home / Self Care | Payer: PRIVATE HEALTH INSURANCE | Source: Ambulatory Visit | Attending: Obstetrics and Gynecology | Admitting: Obstetrics and Gynecology

## 2017-04-17 ENCOUNTER — Inpatient Hospital Stay (HOSPITAL_COMMUNITY): Payer: PRIVATE HEALTH INSURANCE | Admitting: Anesthesiology

## 2017-04-17 ENCOUNTER — Inpatient Hospital Stay (HOSPITAL_COMMUNITY): Admission: RE | Admit: 2017-04-17 | Payer: PRIVATE HEALTH INSURANCE | Source: Ambulatory Visit

## 2017-04-17 ENCOUNTER — Inpatient Hospital Stay (HOSPITAL_COMMUNITY): Payer: PRIVATE HEALTH INSURANCE

## 2017-04-17 ENCOUNTER — Encounter (HOSPITAL_COMMUNITY): Payer: Self-pay

## 2017-04-17 DIAGNOSIS — Z3A38 38 weeks gestation of pregnancy: Secondary | ICD-10-CM | POA: Diagnosis not present

## 2017-04-17 DIAGNOSIS — O365930 Maternal care for other known or suspected poor fetal growth, third trimester, not applicable or unspecified: Principal | ICD-10-CM | POA: Diagnosis present

## 2017-04-17 DIAGNOSIS — O365931 Maternal care for other known or suspected poor fetal growth, third trimester, fetus 1: Secondary | ICD-10-CM | POA: Diagnosis present

## 2017-04-17 LAB — CBC
HCT: 36.7 % (ref 36.0–46.0)
Hemoglobin: 12.6 g/dL (ref 12.0–15.0)
MCH: 33.4 pg (ref 26.0–34.0)
MCHC: 34.3 g/dL (ref 30.0–36.0)
MCV: 97.3 fL (ref 78.0–100.0)
PLATELETS: 218 10*3/uL (ref 150–400)
RBC: 3.77 MIL/uL — AB (ref 3.87–5.11)
RDW: 13.3 % (ref 11.5–15.5)
WBC: 8.2 10*3/uL (ref 4.0–10.5)

## 2017-04-17 LAB — TYPE AND SCREEN
ABO/RH(D): A POS
ANTIBODY SCREEN: NEGATIVE

## 2017-04-17 MED ORDER — BENZOCAINE-MENTHOL 20-0.5 % EX AERO
1.0000 "application " | INHALATION_SPRAY | CUTANEOUS | Status: DC | PRN
  Administered 2017-04-17: 1 via TOPICAL
  Filled 2017-04-17: qty 56

## 2017-04-17 MED ORDER — OXYCODONE HCL 5 MG PO TABS: 5.0000 mg | ORAL_TABLET | ORAL | Status: DC | PRN

## 2017-04-17 MED ORDER — SOD CITRATE-CITRIC ACID 500-334 MG/5ML PO SOLN: 30.0000 mL | ORAL | Status: DC | PRN

## 2017-04-17 MED ORDER — DIPHENHYDRAMINE HCL 50 MG/ML IJ SOLN
12.5000 mg | INTRAMUSCULAR | Status: DC | PRN
Start: 2017-04-17 — End: 2017-04-17

## 2017-04-17 MED ORDER — EPHEDRINE 5 MG/ML INJ
10.0000 mg | INTRAVENOUS | Status: DC | PRN
  Filled 2017-04-17: qty 2

## 2017-04-17 MED ORDER — LIDOCAINE HCL (PF) 1 % IJ SOLN
30.0000 mL | INTRAMUSCULAR | Status: DC | PRN
  Filled 2017-04-17: qty 30

## 2017-04-17 MED ORDER — DIBUCAINE 1 % RE OINT: 1.0000 "application " | TOPICAL_OINTMENT | RECTAL | Status: DC | PRN

## 2017-04-17 MED ORDER — PHENYLEPHRINE 40 MCG/ML (10ML) SYRINGE FOR IV PUSH (FOR BLOOD PRESSURE SUPPORT)
80.0000 ug | PREFILLED_SYRINGE | INTRAVENOUS | Status: DC | PRN
  Filled 2017-04-17: qty 5

## 2017-04-17 MED ORDER — TERBUTALINE SULFATE 1 MG/ML IJ SOLN
0.2500 mg | Freq: Once | INTRAMUSCULAR | Status: DC | PRN
  Filled 2017-04-17: qty 1

## 2017-04-17 MED ORDER — PHENYLEPHRINE 40 MCG/ML (10ML) SYRINGE FOR IV PUSH (FOR BLOOD PRESSURE SUPPORT)
80.0000 ug | PREFILLED_SYRINGE | INTRAVENOUS | Status: DC | PRN
  Filled 2017-04-17 (×2): qty 10
  Filled 2017-04-17: qty 5

## 2017-04-17 MED ORDER — SENNOSIDES-DOCUSATE SODIUM 8.6-50 MG PO TABS
2.0000 | ORAL_TABLET | ORAL | Status: DC
  Administered 2017-04-18 (×2): 2 via ORAL
  Filled 2017-04-17 (×2): qty 2

## 2017-04-17 MED ORDER — ZOLPIDEM TARTRATE 5 MG PO TABS: 5.0000 mg | ORAL_TABLET | Freq: Every evening | ORAL | Status: DC | PRN

## 2017-04-17 MED ORDER — ACETAMINOPHEN 325 MG PO TABS
650.0000 mg | ORAL_TABLET | ORAL | Status: DC | PRN
Start: 2017-04-17 — End: 2017-04-19

## 2017-04-17 MED ORDER — ONDANSETRON HCL 4 MG PO TABS
4.0000 mg | ORAL_TABLET | ORAL | Status: DC | PRN
Start: 2017-04-17 — End: 2017-04-19

## 2017-04-17 MED ORDER — BUTORPHANOL TARTRATE 1 MG/ML IJ SOLN: 1.0000 mg | INTRAMUSCULAR | Status: DC | PRN

## 2017-04-17 MED ORDER — WITCH HAZEL-GLYCERIN EX PADS: 1.0000 "application " | MEDICATED_PAD | CUTANEOUS | Status: DC | PRN

## 2017-04-17 MED ORDER — LACTATED RINGERS IV SOLN: 500.0000 mL | Freq: Once | INTRAVENOUS | Status: DC

## 2017-04-17 MED ORDER — OXYCODONE HCL 5 MG PO TABS: 10.0000 mg | ORAL_TABLET | ORAL | Status: DC | PRN

## 2017-04-17 MED ORDER — OXYCODONE-ACETAMINOPHEN 5-325 MG PO TABS: 2.0000 | ORAL_TABLET | ORAL | Status: DC | PRN

## 2017-04-17 MED ORDER — METHYLERGONOVINE MALEATE 0.2 MG PO TABS: 0.2000 mg | ORAL_TABLET | ORAL | Status: DC | PRN

## 2017-04-17 MED ORDER — SIMETHICONE 80 MG PO CHEW
80.0000 mg | CHEWABLE_TABLET | ORAL | Status: DC | PRN
Start: 2017-04-17 — End: 2017-04-19

## 2017-04-17 MED ORDER — FENTANYL 2.5 MCG/ML BUPIVACAINE 1/10 % EPIDURAL INFUSION (WH - ANES)
14.0000 mL/h | INTRAMUSCULAR | Status: DC | PRN
  Administered 2017-04-17: 14 mL/h via EPIDURAL
  Filled 2017-04-17 (×2): qty 100

## 2017-04-17 MED ORDER — METHYLERGONOVINE MALEATE 0.2 MG/ML IJ SOLN: 0.2000 mg | INTRAMUSCULAR | Status: DC | PRN

## 2017-04-17 MED ORDER — OXYCODONE-ACETAMINOPHEN 5-325 MG PO TABS: 1.0000 | ORAL_TABLET | ORAL | Status: DC | PRN

## 2017-04-17 MED ORDER — COCONUT OIL OIL: 1.0000 "application " | TOPICAL_OIL | Status: DC | PRN

## 2017-04-17 MED ORDER — LACTATED RINGERS IV SOLN
INTRAVENOUS | Status: DC
  Administered 2017-04-17 (×2): 1000 mL via INTRAVENOUS

## 2017-04-17 MED ORDER — TETANUS-DIPHTH-ACELL PERTUSSIS 5-2.5-18.5 LF-MCG/0.5 IM SUSP: 0.5000 mL | Freq: Once | INTRAMUSCULAR | Status: DC

## 2017-04-17 MED ORDER — LIDOCAINE HCL (PF) 1 % IJ SOLN
INTRAMUSCULAR | Status: DC | PRN
  Administered 2017-04-17 (×2): 5 mL via EPIDURAL

## 2017-04-17 MED ORDER — IBUPROFEN 600 MG PO TABS
600.0000 mg | ORAL_TABLET | Freq: Four times a day (QID) | ORAL | Status: DC
  Administered 2017-04-17 – 2017-04-18 (×4): 600 mg via ORAL
  Filled 2017-04-17 (×5): qty 1

## 2017-04-17 MED ORDER — DIPHENHYDRAMINE HCL 25 MG PO CAPS: 25.0000 mg | ORAL_CAPSULE | Freq: Four times a day (QID) | ORAL | Status: DC | PRN

## 2017-04-17 MED ORDER — OXYTOCIN 40 UNITS IN LACTATED RINGERS INFUSION - SIMPLE MED
2.5000 [IU]/h | INTRAVENOUS | Status: DC
  Administered 2017-04-17: 2.5 [IU]/h via INTRAVENOUS

## 2017-04-17 MED ORDER — MAGNESIUM HYDROXIDE 400 MG/5ML PO SUSP: 30.0000 mL | ORAL | Status: DC | PRN

## 2017-04-17 MED ORDER — OXYTOCIN BOLUS FROM INFUSION
500.0000 mL | Freq: Once | INTRAVENOUS | Status: AC
  Administered 2017-04-17: 500 mL via INTRAVENOUS

## 2017-04-17 MED ORDER — ONDANSETRON HCL 4 MG/2ML IJ SOLN: 4.0000 mg | Freq: Four times a day (QID) | INTRAMUSCULAR | Status: DC | PRN

## 2017-04-17 MED ORDER — APIXABAN 5 MG PO TABS
5.0000 mg | ORAL_TABLET | Freq: Two times a day (BID) | ORAL | Status: DC
  Filled 2017-04-17: qty 1

## 2017-04-17 MED ORDER — OXYTOCIN 40 UNITS IN LACTATED RINGERS INFUSION - SIMPLE MED
1.0000 m[IU]/min | INTRAVENOUS | Status: DC
  Administered 2017-04-17: 2 m[IU]/min via INTRAVENOUS
  Filled 2017-04-17: qty 1000

## 2017-04-17 MED ORDER — LACTATED RINGERS IV SOLN: 500.0000 mL | INTRAVENOUS | Status: DC | PRN

## 2017-04-17 MED ORDER — ACETAMINOPHEN 325 MG PO TABS: 650.0000 mg | ORAL_TABLET | ORAL | Status: DC | PRN

## 2017-04-17 MED ORDER — PRENATAL MULTIVITAMIN CH
1.0000 | ORAL_TABLET | Freq: Every day | ORAL | Status: DC
  Administered 2017-04-18: 1 via ORAL
  Filled 2017-04-17: qty 1

## 2017-04-17 MED ORDER — MEASLES, MUMPS & RUBELLA VAC ~~LOC~~ INJ: 0.5000 mL | INJECTION | Freq: Once | SUBCUTANEOUS | Status: DC

## 2017-04-17 MED ORDER — ONDANSETRON HCL 4 MG/2ML IJ SOLN: 4.0000 mg | INTRAMUSCULAR | Status: DC | PRN

## 2017-04-17 NOTE — Anesthesia Preprocedure Evaluation (Signed)
Anesthesia Evaluation  Patient identified by MRN, date of birth, ID band Patient awake    Reviewed: Allergy & Precautions, NPO status , Patient's Chart, lab work & pertinent test results  History of Anesthesia Complications (+) Family history of anesthesia reaction  Airway Mallampati: I  TM Distance: >3 FB Neck ROM: Full    Dental  (+) Teeth Intact, Dental Advisory Given   Pulmonary neg pulmonary ROS,    breath sounds clear to auscultation       Cardiovascular + Peripheral Vascular Disease   Rhythm:Regular Rate:Normal     Neuro/Psych Seizures -,  PSYCHIATRIC DISORDERS Anxiety    GI/Hepatic negative GI ROS, Neg liver ROS,   Endo/Other  negative endocrine ROS  Renal/GU negative Renal ROS  negative genitourinary   Musculoskeletal negative musculoskeletal ROS (+)   Abdominal   Peds negative pediatric ROS (+)  Hematology negative hematology ROS (+)   Anesthesia Other Findings   Reproductive/Obstetrics negative OB ROS                             Lab Results  Component Value Date   WBC 8.2 04/17/2017   HGB 12.6 04/17/2017   HCT 36.7 04/17/2017   MCV 97.3 04/17/2017   PLT 218 04/17/2017   Lab Results  Component Value Date   CREATININE 0.48 07/22/2016   BUN <5 (L) 07/22/2016   NA 137 07/22/2016   K 3.5 07/22/2016   CL 104 07/22/2016   CO2 26 07/22/2016   Lab Results  Component Value Date   INR 1.10 07/30/2016   INR 1.12 07/20/2016   INR 1.04 07/16/2016    06/2016 EKG: atrial fibrillation.   Anesthesia Physical  Anesthesia Plan  ASA: II  Anesthesia Plan: Epidural   Post-op Pain Management:    Induction:   PONV Risk Score and Plan:   Airway Management Planned: Natural Airway  Additional Equipment:   Intra-op Plan:   Post-operative Plan:   Informed Consent: I have reviewed the patients History and Physical, chart, labs and discussed the procedure including the  risks, benefits and alternatives for the proposed anesthesia with the patient or authorized representative who has indicated his/her understanding and acceptance.   Dental advisory given  Plan Discussed with: Anesthesiologist  Anesthesia Plan Comments:         Anesthesia Quick Evaluation

## 2017-04-17 NOTE — Anesthesia Procedure Notes (Signed)
Epidural Patient location during procedure: OB Start time: 04/17/2017 12:18 PM End time: 04/17/2017 12:28 PM  Staffing Anesthesiologist: Heather RobertsSINGER, [name] Kiehn Performed: anesthesiologist   Preanesthetic Checklist Completed: patient identified, site marked, pre-op evaluation, timeout performed, IV checked, risks and benefits discussed and monitors and equipment checked  Epidural Patient position: sitting Prep: DuraPrep Patient monitoring: heart rate, cardiac monitor, continuous pulse ox and blood pressure Approach: midline Location: L2-L3 Injection technique: LOR saline  Needle:  Needle type: Tuohy  Needle gauge: 17 G Needle length: 9 cm Needle insertion depth: 5 cm Catheter size: 20 Guage Catheter at skin depth: 11 cm Test dose: negative and Other  Assessment Events: blood not aspirated, injection not painful, no injection resistance and negative IV test  Additional Notes Informed consent obtained prior to proceeding including risk of failure, 1% risk of PDPH, risk of minor discomfort and bruising.  Discussed rare but serious complications including epidural abscess, permanent nerve injury, epidural hematoma.  Discussed alternatives to epidural analgesia and patient desires to proceed.  Timeout performed pre-procedure verifying patient name, procedure, and platelet count.  Patient tolerated procedure well.

## 2017-04-17 NOTE — Anesthesia Postprocedure Evaluation (Signed)
Anesthesia Post Note  Patient: [name] [name]  Procedure(s) Performed: * No procedures listed *     Patient location during evaluation: Mother Baby Anesthesia Type: Epidural Level of consciousness: awake Pain management: pain level controlled Vital Signs Assessment: post-procedure vital signs reviewed and stable Respiratory status: spontaneous breathing Cardiovascular status: stable Postop Assessment: no headache, no backache, epidural receding, patient able to bend at knees, no signs of nausea or vomiting and adequate PO intake Anesthetic complications: no    Last Vitals:  Vitals:   04/17/17 1330 04/17/17 1345  BP: 110/66 108/67  Pulse: 70 64  Resp:    Temp:      Last Pain:  Vitals:   04/17/17 1401  TempSrc:   PainSc: 0-No pain   Pain Goal: Patients Stated Pain Goal: 1 (04/17/17 1108)               Thomasina Housley
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
## 2017-04-18 MED ORDER — IBUPROFEN 600 MG PO TABS
600.0000 mg | ORAL_TABLET | Freq: Four times a day (QID) | ORAL | 0 refills | Status: DC
Start: 2017-04-18 — End: 2019-03-26

## 2017-04-18 NOTE — Discharge Instructions (Signed)
As per discharge pamphlet °

## 2017-04-18 NOTE — Lactation Note (Signed)
This note was copied from a baby's chart. Lactation Consultation Note  Patient Name: [name] Jackqulyn LivingsCasey [name] [id number]'s Date: 04/18/2017 Reason for consult: Follow-up assessment  [facility] and so requesting to supplement the baby after breast feeding.  LC reviewed basics and potential feeding behaviors with less than 6 pound infant.  Mom feels the baby isn't getting enough and desires to supplement with formula.  LC recommended feeding the baby on the 1st beast 20 mins , supplementing, ( using the guidelines ) and Then post pump 10 -15 mins, ave milk for next feeding.  See doc flow sheets for details.  Sore nipple and engorgement prevention and tx reviewed.  Per mom will have a DEBP at home.  LC also showed mom and dad how to PACE feed the baby/    Maternal Data Does the patient have breastfeeding experience prior to this delivery?: Yes  Feeding Feeding Type: Formula Nipple Type: Slow - flow Length of feed: 10 min (swallows noted with increased depth )  LATCH Score/Interventions Latch: Grasps breast easily, tongue down, lips flanged, rhythmical sucking.  Audible Swallowing: A few with stimulation  Type of Nipple: Everted at rest and after stimulation  Comfort (Breast/Nipple): Soft / non-tender     Hold (Positioning): Assistance needed to correctly position infant at breast and maintain latch. Intervention(s): Breastfeeding basics reviewed  LATCH Score: 8  Lactation Tools Discussed/Used Tools: Pump Breast pump type: Double-Electric Breast Pump (and manuel pump ) WIC Program: No Initiated by:: MAI  Date initiated:: 04/18/17   Consult Status Consult Status: Complete Date: 04/18/17 Follow-up type: In-patient    [name] [name] 04/18/2017, 4:58 PM

## 2017-04-18 NOTE — Progress Notes (Signed)
Patient ID: [name] [name], female   DOB: [birth year], 36 y.o.   MRN: [id number] PPD #1 No problems, wants to go home Afeb, VSS Fundus firm, NT at U-1 Continue routine postpartum care, says she only needs baby ASA for her carotid issue so will d/c Eliquis, d/c home later today if baby ok to go

## 2017-04-18 NOTE — Progress Notes (Signed)
CSW attempted to meet with MOB at bedside to complete consult for hx of ETOH abuse. Upon this writers arrival, MOB was accompanied by lactation and visitor. This writer will attempt to meet with MOB tomorrow.   Arianni Gallego, MSW, LCSW-A Clinical Social Worker  [facility]  Office: [phone]  
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
## 2017-04-19 NOTE — Progress Notes (Signed)
CSW spoke with MOB regarding hx of ETOH abuse. MOB acknowledged hx noting she received rehab/detox in October of 2017 prior to pregnancy. MOB notes she has not had alcohol since September 2017 and has no further psychosocial needs at this time. Baby is safe to d/c in the care of MOB.   Jakirah Zaun, MSW, LCSW-A Clinical Social Worker  [facility]  Office: [phone]

## 2017-04-23 LAB — RPR: RPR Ser Ql: NONREACTIVE

## 2017-04-29 ENCOUNTER — Inpatient Hospital Stay (HOSPITAL_COMMUNITY)
Admission: AD | Admit: 2017-04-29 | Payer: PRIVATE HEALTH INSURANCE | Source: Ambulatory Visit | Admitting: Obstetrics and Gynecology

## 2017-06-02 ENCOUNTER — Other Ambulatory Visit: Payer: Self-pay

## 2017-06-02 ENCOUNTER — Telehealth: Payer: Self-pay | Admitting: Vascular Surgery

## 2017-06-02 DIAGNOSIS — I7771 Dissection of carotid artery: Secondary | ICD-10-CM

## 2017-06-02 NOTE — Telephone Encounter (Signed)
-----   Message from Sherren Kernsharles E Fields, MD sent at 06/02/2017 11:36 AM EDT ----- Regarding: RE: CT appt question Contact: [phone] We can try carotid duplex in office  Fabienne Brunsharles Fields ----- Message ----- From: Shari Prowsobin, Annette W Sent: 06/02/2017  10:47 AM To: Sherren Kernsharles E Fields, MD Subject: CT appt question                               Dr.Fields, This patient is scheduled to see you on 06/11/17 following a CTA Neck that is scheduled for tomorrow.  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 However, the patient's insurance won't cover most of the CTA and the patient will have to pay over $1000, so she called to ask if you deemed this completely necessary or if there is any other ultrasound or testing that may not be as costly. Please advise and I will contact the patient. Thanks, Annete

## 2017-06-02 NOTE — Telephone Encounter (Signed)
Per instructions from Dr.Fields, I scheduled a L carotid US on 06/11/17 prior to the patient seeing him on that date. I called the patient to notify her of Dr.Fields instructions and she verbalized agreement.  awt

## 2017-06-03 ENCOUNTER — Other Ambulatory Visit: Payer: PRIVATE HEALTH INSURANCE

## 2017-06-09 ENCOUNTER — Encounter: Payer: Self-pay | Admitting: Vascular Surgery

## 2017-06-11 ENCOUNTER — Ambulatory Visit: Payer: PRIVATE HEALTH INSURANCE | Admitting: Vascular Surgery

## 2017-06-11 ENCOUNTER — Encounter (HOSPITAL_COMMUNITY): Payer: PRIVATE HEALTH INSURANCE

## 2017-10-15 ENCOUNTER — Ambulatory Visit: Payer: PRIVATE HEALTH INSURANCE | Admitting: Vascular Surgery

## 2017-10-15 ENCOUNTER — Encounter (HOSPITAL_COMMUNITY): Payer: PRIVATE HEALTH INSURANCE

## 2017-11-19 ENCOUNTER — Encounter (HOSPITAL_COMMUNITY): Payer: PRIVATE HEALTH INSURANCE

## 2017-11-19 ENCOUNTER — Ambulatory Visit: Payer: PRIVATE HEALTH INSURANCE | Admitting: Vascular Surgery

## 2017-12-10 ENCOUNTER — Ambulatory Visit: Payer: PRIVATE HEALTH INSURANCE | Admitting: Vascular Surgery

## 2017-12-10 ENCOUNTER — Ambulatory Visit (HOSPITAL_COMMUNITY)
Admission: RE | Admit: 2017-12-10 | Discharge: 2017-12-10 | Disposition: A | Discharge status: Home / Self Care | Payer: PRIVATE HEALTH INSURANCE | Source: Ambulatory Visit | Attending: Vascular Surgery | Admitting: Vascular Surgery

## 2017-12-10 ENCOUNTER — Encounter: Payer: Self-pay | Admitting: Vascular Surgery

## 2017-12-10 VITALS — BP 105/74 | HR 70 | Resp 18 | Ht 67.0 in | Wt 125.0 lb

## 2017-12-10 DIAGNOSIS — S15002D Unspecified injury of left carotid artery, subsequent encounter: Secondary | ICD-10-CM | POA: Diagnosis not present

## 2017-12-10 DIAGNOSIS — I6522 Occlusion and stenosis of left carotid artery: Secondary | ICD-10-CM | POA: Diagnosis not present

## 2017-12-10 DIAGNOSIS — I7771 Dissection of carotid artery: Secondary | ICD-10-CM
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
## 2018-09-09 LAB — OB RESULTS CONSOLE GC/CHLAMYDIA
Chlamydia: NEGATIVE
Gonorrhea: NEGATIVE

## 2018-09-09 LAB — OB RESULTS CONSOLE ANTIBODY SCREEN: Antibody Screen: NEGATIVE

## 2018-09-09 LAB — OB RESULTS CONSOLE ABO/RH: RH Type: POSITIVE

## 2018-09-09 LAB — OB RESULTS CONSOLE HEPATITIS B SURFACE ANTIGEN: Hepatitis B Surface Ag: NEGATIVE

## 2018-09-09 LAB — OB RESULTS CONSOLE RPR: RPR: NONREACTIVE

## 2018-09-09 LAB — OB RESULTS CONSOLE RUBELLA ANTIBODY, IGM: Rubella: IMMUNE

## 2018-09-09 LAB — OB RESULTS CONSOLE HIV ANTIBODY (ROUTINE TESTING): HIV: NONREACTIVE

## 2018-09-29 NOTE — L&D Delivery Note (Signed)
Pt presented C/C/+1.  AROM clear fluid.  With 2 pushes SVD viable female, ROA, no complications.  Apgars and weight pending, I would say 9,9 Apgars.  Placenta spontaneous, intact.  Perineum with some small, hemostatic abrasions, not repaired.  EBL 150 cc. For routine postpartum care, labor too fast for antibiotics for +GBS.  They would like circumcision done in the hospital.

## 2019-02-11 ENCOUNTER — Other Ambulatory Visit: Payer: Self-pay

## 2019-02-11 ENCOUNTER — Encounter (HOSPITAL_COMMUNITY): Payer: Self-pay | Admitting: *Deleted

## 2019-02-11 ENCOUNTER — Inpatient Hospital Stay (HOSPITAL_COMMUNITY)
Admission: AD | Admit: 2019-02-11 | Discharge: 2019-02-11 | Disposition: A | Discharge status: Home / Self Care | Payer: PRIVATE HEALTH INSURANCE | Attending: Obstetrics and Gynecology | Admitting: Obstetrics and Gynecology

## 2019-02-11 DIAGNOSIS — Z3A34 34 weeks gestation of pregnancy: Secondary | ICD-10-CM | POA: Diagnosis not present

## 2019-02-11 DIAGNOSIS — O4703 False labor before 37 completed weeks of gestation, third trimester: Secondary | ICD-10-CM | POA: Diagnosis present

## 2019-02-11 MED ORDER — BETAMETHASONE SOD PHOS & ACET 6 (3-3) MG/ML IJ SUSP
12.0000 mg | Freq: Once | INTRAMUSCULAR | Status: AC
  Administered 2019-02-11: 10:00:00 12 mg via INTRAMUSCULAR
  Filled 2019-02-11 (×2): qty 2

## 2019-02-11 NOTE — MAU Note (Signed)
Pharmacy called to check on medication

## 2019-02-11 NOTE — MAU Note (Signed)
Pt at high risk , hx of PTL. Sent in for Betamethasone injections.  To come in today and tomorrow.  Order faxed in , tomorrows order entered in sign and held.  No complaints from patient.  nkda.

## 2019-02-12 ENCOUNTER — Inpatient Hospital Stay (HOSPITAL_COMMUNITY)
Admission: AD | Admit: 2019-02-12 | Discharge: 2019-02-12 | Disposition: A | Discharge status: Home / Self Care | Payer: PRIVATE HEALTH INSURANCE | Attending: Obstetrics and Gynecology | Admitting: Obstetrics and Gynecology

## 2019-02-12 DIAGNOSIS — O4703 False labor before 37 completed weeks of gestation, third trimester: Secondary | ICD-10-CM | POA: Diagnosis present

## 2019-02-12 DIAGNOSIS — Z3A34 34 weeks gestation of pregnancy: Secondary | ICD-10-CM | POA: Insufficient documentation

## 2019-02-12 MED ORDER — BETAMETHASONE SOD PHOS & ACET 6 (3-3) MG/ML IJ SUSP
12.0000 mg | Freq: Once | INTRAMUSCULAR | Status: AC
  Administered 2019-02-12: 12 mg via INTRAMUSCULAR
  Filled 2019-02-12: qty 2

## 2019-02-12 NOTE — MAU Note (Signed)
[name] [name] is a 38 y.o. at [redacted]w[redacted]d here in MAU reporting: here for 2nd BMZ, states no pain, bleeding, or LOF. States normal fetal movement.   Pain score: 0/10  Vitals:   02/12/19 0920  BP: 114/62  Pulse: (!) 106  Resp: 18  Temp: 98.4 F (36.9 C)  SpO2: 99%      FHT: 152  Lab orders placed from triage: none  BMZ order released

## 2019-03-03 LAB — OB RESULTS CONSOLE GBS: GBS: POSITIVE

## 2019-03-15 ENCOUNTER — Telehealth (HOSPITAL_COMMUNITY): Payer: Self-pay | Admitting: *Deleted

## 2019-03-15 ENCOUNTER — Other Ambulatory Visit (HOSPITAL_COMMUNITY): Payer: Self-pay | Admitting: *Deleted

## 2019-03-15 NOTE — Telephone Encounter (Signed)
Preadmission screen  

## 2019-03-16 ENCOUNTER — Telehealth (HOSPITAL_COMMUNITY): Payer: Self-pay | Admitting: *Deleted

## 2019-03-16 NOTE — Telephone Encounter (Signed)
Preadmission screen  

## 2019-03-22 ENCOUNTER — Other Ambulatory Visit: Payer: Self-pay

## 2019-03-22 ENCOUNTER — Other Ambulatory Visit (HOSPITAL_COMMUNITY)
Admission: RE | Admit: 2019-03-22 | Discharge: 2019-03-22 | Disposition: A | Discharge status: Home / Self Care | Payer: PRIVATE HEALTH INSURANCE | Source: Ambulatory Visit | Attending: Obstetrics and Gynecology | Admitting: Obstetrics and Gynecology

## 2019-03-22 DIAGNOSIS — Z1159 Encounter for screening for other viral diseases: Secondary | ICD-10-CM | POA: Diagnosis not present

## 2019-03-22 LAB — SARS CORONAVIRUS 2 (TAT 6-24 HRS): SARS Coronavirus 2: NEGATIVE

## 2019-03-23 ENCOUNTER — Other Ambulatory Visit (HOSPITAL_COMMUNITY)
Admission: RE | Admit: 2019-03-23 | Discharge: 2019-03-23 | Disposition: A | Discharge status: Home / Self Care | Payer: PRIVATE HEALTH INSURANCE | Source: Ambulatory Visit

## 2019-03-24 ENCOUNTER — Inpatient Hospital Stay (HOSPITAL_COMMUNITY)
Admission: AD | Admit: 2019-03-24 | Discharge: 2019-03-26 | DRG: 807 | Disposition: A | Discharge status: Home / Self Care | Payer: PRIVATE HEALTH INSURANCE | Attending: Obstetrics and Gynecology | Admitting: Obstetrics and Gynecology

## 2019-03-24 ENCOUNTER — Other Ambulatory Visit: Payer: Self-pay | Admitting: Obstetrics and Gynecology

## 2019-03-24 DIAGNOSIS — O99824 Streptococcus B carrier state complicating childbirth: Secondary | ICD-10-CM | POA: Diagnosis present

## 2019-03-24 DIAGNOSIS — O26893 Other specified pregnancy related conditions, third trimester: Secondary | ICD-10-CM | POA: Diagnosis present

## 2019-03-24 DIAGNOSIS — Z3A38 38 weeks gestation of pregnancy: Secondary | ICD-10-CM

## 2019-03-24 MED ORDER — WITCH HAZEL-GLYCERIN EX PADS: 1.0000 "application " | MEDICATED_PAD | CUTANEOUS | Status: DC | PRN

## 2019-03-24 MED ORDER — IBUPROFEN 600 MG PO TABS
600.0000 mg | ORAL_TABLET | Freq: Four times a day (QID) | ORAL | Status: DC
  Administered 2019-03-24 – 2019-03-26 (×7): 600 mg via ORAL
  Filled 2019-03-24 (×7): qty 1

## 2019-03-24 MED ORDER — LACTATED RINGERS IV SOLN: 500.0000 mL | INTRAVENOUS | Status: DC | PRN

## 2019-03-24 MED ORDER — ONDANSETRON HCL 4 MG/2ML IJ SOLN: 4.0000 mg | INTRAMUSCULAR | Status: DC | PRN

## 2019-03-24 MED ORDER — SODIUM CHLORIDE 0.9 % IV SOLN
2.0000 g | Freq: Once | INTRAVENOUS | Status: DC
  Filled 2019-03-24: qty 2000

## 2019-03-24 MED ORDER — PRENATAL MULTIVITAMIN CH
1.0000 | ORAL_TABLET | Freq: Every day | ORAL | Status: DC
  Administered 2019-03-25 – 2019-03-26 (×2): 1 via ORAL
  Filled 2019-03-24 (×2): qty 1

## 2019-03-24 MED ORDER — DIBUCAINE (PERIANAL) 1 % EX OINT: 1.0000 "application " | TOPICAL_OINTMENT | CUTANEOUS | Status: DC | PRN

## 2019-03-24 MED ORDER — BENZOCAINE-MENTHOL 20-0.5 % EX AERO: 1.0000 "application " | INHALATION_SPRAY | CUTANEOUS | Status: DC | PRN

## 2019-03-24 MED ORDER — OXYCODONE HCL 5 MG PO TABS: 10.0000 mg | ORAL_TABLET | ORAL | Status: DC | PRN

## 2019-03-24 MED ORDER — SOD CITRATE-CITRIC ACID 500-334 MG/5ML PO SOLN: 30.0000 mL | ORAL | Status: DC | PRN

## 2019-03-24 MED ORDER — ZOLPIDEM TARTRATE 5 MG PO TABS: 5.0000 mg | ORAL_TABLET | Freq: Every evening | ORAL | Status: DC | PRN

## 2019-03-24 MED ORDER — ACETAMINOPHEN 325 MG PO TABS
650.0000 mg | ORAL_TABLET | ORAL | Status: DC | PRN
  Administered 2019-03-24: 650 mg via ORAL
  Filled 2019-03-24: qty 2

## 2019-03-24 MED ORDER — OXYCODONE HCL 5 MG PO TABS: 5.0000 mg | ORAL_TABLET | ORAL | Status: DC | PRN

## 2019-03-24 MED ORDER — LIDOCAINE HCL (PF) 1 % IJ SOLN
INTRAMUSCULAR | Status: AC
  Filled 2019-03-24: qty 30

## 2019-03-24 MED ORDER — OXYTOCIN 10 UNIT/ML IJ SOLN
INTRAMUSCULAR | Status: AC
  Administered 2019-03-24: 10 [IU] via INTRAMUSCULAR
  Filled 2019-03-24: qty 1

## 2019-03-24 MED ORDER — TETANUS-DIPHTH-ACELL PERTUSSIS 5-2.5-18.5 LF-MCG/0.5 IM SUSP: 0.5000 mL | Freq: Once | INTRAMUSCULAR | Status: DC

## 2019-03-24 MED ORDER — LACTATED RINGERS IV SOLN: INTRAVENOUS | Status: DC

## 2019-03-24 MED ORDER — SIMETHICONE 80 MG PO CHEW: 80.0000 mg | CHEWABLE_TABLET | ORAL | Status: DC | PRN

## 2019-03-24 MED ORDER — COCONUT OIL OIL: 1.0000 "application " | TOPICAL_OIL | Status: DC | PRN

## 2019-03-24 MED ORDER — BUTORPHANOL TARTRATE 1 MG/ML IJ SOLN: 1.0000 mg | INTRAMUSCULAR | Status: DC | PRN

## 2019-03-24 MED ORDER — OXYTOCIN 40 UNITS IN NORMAL SALINE INFUSION - SIMPLE MED: 2.5000 [IU]/h | INTRAVENOUS | Status: DC

## 2019-03-24 MED ORDER — OXYCODONE-ACETAMINOPHEN 5-325 MG PO TABS: 2.0000 | ORAL_TABLET | ORAL | Status: DC | PRN

## 2019-03-24 MED ORDER — METHYLERGONOVINE MALEATE 0.2 MG/ML IJ SOLN: 0.2000 mg | INTRAMUSCULAR | Status: DC | PRN

## 2019-03-24 MED ORDER — OXYTOCIN 40 UNITS IN NORMAL SALINE INFUSION - SIMPLE MED
INTRAVENOUS | Status: AC
  Filled 2019-03-24: qty 1000

## 2019-03-24 MED ORDER — MAGNESIUM HYDROXIDE 400 MG/5ML PO SUSP
30.0000 mL | ORAL | Status: DC | PRN
  Filled 2019-03-24: qty 30

## 2019-03-24 MED ORDER — ONDANSETRON HCL 4 MG PO TABS: 4.0000 mg | ORAL_TABLET | ORAL | Status: DC | PRN

## 2019-03-24 MED ORDER — OXYCODONE-ACETAMINOPHEN 5-325 MG PO TABS: 1.0000 | ORAL_TABLET | ORAL | Status: DC | PRN

## 2019-03-24 MED ORDER — OXYTOCIN 40 UNITS IN NORMAL SALINE INFUSION - SIMPLE MED: 1.0000 m[IU]/min | INTRAVENOUS | Status: DC

## 2019-03-24 MED ORDER — SENNOSIDES-DOCUSATE SODIUM 8.6-50 MG PO TABS
2.0000 | ORAL_TABLET | ORAL | Status: DC
  Administered 2019-03-24 – 2019-03-25 (×2): 2 via ORAL
  Filled 2019-03-24 (×4): qty 2

## 2019-03-24 MED ORDER — MEASLES, MUMPS & RUBELLA VAC IJ SOLR: 0.5000 mL | Freq: Once | INTRAMUSCULAR | Status: DC

## 2019-03-24 MED ORDER — DIPHENHYDRAMINE HCL 25 MG PO CAPS: 25.0000 mg | ORAL_CAPSULE | Freq: Four times a day (QID) | ORAL | Status: DC | PRN

## 2019-03-24 MED ORDER — OXYTOCIN BOLUS FROM INFUSION: 500.0000 mL | Freq: Once | INTRAVENOUS | Status: DC

## 2019-03-24 MED ORDER — ACETAMINOPHEN 325 MG PO TABS: 650.0000 mg | ORAL_TABLET | ORAL | Status: DC | PRN

## 2019-03-24 MED ORDER — OXYTOCIN 10 UNIT/ML IJ SOLN
10.0000 [IU] | Freq: Once | INTRAMUSCULAR | Status: AC
  Administered 2019-03-24: 20:00:00 10 [IU] via INTRAMUSCULAR

## 2019-03-24 MED ORDER — METHYLERGONOVINE MALEATE 0.2 MG PO TABS: 0.2000 mg | ORAL_TABLET | ORAL | Status: DC | PRN

## 2019-03-24 MED ORDER — LIDOCAINE HCL (PF) 1 % IJ SOLN
30.0000 mL | INTRAMUSCULAR | Status: DC | PRN
  Filled 2019-03-24: qty 30

## 2019-03-24 MED ORDER — TERBUTALINE SULFATE 1 MG/ML IJ SOLN
0.2500 mg | Freq: Once | INTRAMUSCULAR | Status: DC | PRN
  Filled 2019-03-24: qty 1

## 2019-03-24 MED ORDER — ONDANSETRON HCL 4 MG/2ML IJ SOLN: 4.0000 mg | Freq: Four times a day (QID) | INTRAMUSCULAR | Status: DC | PRN

## 2019-03-24 MED ORDER — ERYTHROMYCIN 5 MG/GM OP OINT
TOPICAL_OINTMENT | OPHTHALMIC | Status: AC
  Filled 2019-03-24: qty 1

## 2019-03-24 NOTE — MAU Note (Signed)
Dr. Willis Modena at bedside for delivery
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
## 2019-03-24 NOTE — MAU Note (Signed)
Patient presents with strong contractions and involuntarily pushing.  Dr. Willis Modena contacted to report to MAU for delivery.  Maryelizabeth Kaufmann, CNM at bedside to standby for delivery if MD unable to get here in time.

## 2019-03-25 ENCOUNTER — Inpatient Hospital Stay (HOSPITAL_COMMUNITY): Payer: PRIVATE HEALTH INSURANCE

## 2019-03-25 ENCOUNTER — Encounter (HOSPITAL_COMMUNITY): Payer: Self-pay

## 2019-03-25 LAB — CBC
HCT: 34.4 % — ABNORMAL LOW (ref 36.0–46.0)
HCT: 35.7 % — ABNORMAL LOW (ref 36.0–46.0)
Hemoglobin: 11.7 g/dL — ABNORMAL LOW (ref 12.0–15.0)
Hemoglobin: 12.3 g/dL (ref 12.0–15.0)
MCH: 33.3 pg (ref 26.0–34.0)
MCH: 34.1 pg — ABNORMAL HIGH (ref 26.0–34.0)
MCHC: 34 g/dL (ref 30.0–36.0)
MCHC: 34.5 g/dL (ref 30.0–36.0)
MCV: 98 fL (ref 80.0–100.0)
MCV: 98.9 fL (ref 80.0–100.0)
Platelets: 189 10*3/uL (ref 150–400)
Platelets: 212 10*3/uL (ref 150–400)
RBC: 3.51 MIL/uL — ABNORMAL LOW (ref 3.87–5.11)
RBC: 3.61 MIL/uL — ABNORMAL LOW (ref 3.87–5.11)
RDW: 11.8 % (ref 11.5–15.5)
RDW: 11.9 % (ref 11.5–15.5)
WBC: 12.3 10*3/uL — ABNORMAL HIGH (ref 4.0–10.5)
WBC: 8.2 10*3/uL (ref 4.0–10.5)
nRBC: 0 % (ref 0.0–0.2)
nRBC: 0 % (ref 0.0–0.2)

## 2019-03-25 LAB — TYPE AND SCREEN
ABO/RH(D): A POS
Antibody Screen: NEGATIVE

## 2019-03-25 LAB — ABO/RH: ABO/RH(D): A POS

## 2019-03-25 LAB — RPR: RPR Ser Ql: NONREACTIVE

## 2019-03-25 NOTE — Lactation Note (Signed)
This note was copied from a baby's chart. Lactation Consultation Note  Patient Name: [name] [name] Date: 03/25/2019 Reason for consult: Initial assessment;Early term 37-38.6wks;Other (Comment)(per mom has 2 other boys and 1 girl)  Baby is 41 hours old  LC reviewed and updated the doc flow sheets per mom.  Baby presently having hearing screen.  Per mom baby last  Fed at 3:45 pm for 30 mins .  Baby has been supplemented per mom small amount due to baby cluster feeding and  Mom desires to keep breast feeding simple this baby and does not want to pump.  LC reassured mom she can keep it simple. LC recommended to offer both breast prior to  Supplementing and keep it low.   Mom aware she can call for feeding assessment by LC.   LC provided the [facility] resources for [facility] after D/C . Pamphlet with phone number provided.    Maternal Data Has patient been taught Hand Expression?: (per mom familiar with hand expressing)  Feeding Feeding Type: (baby last fed at 3:45 p for 30 mins)  LATCH Score                   Interventions Interventions: Breast feeding basics reviewed  Lactation Tools Discussed/Used     Consult Status Consult Status: Follow-up Date: 03/26/19 Follow-up type: In-patient    [place] 03/25/2019, 5:47 PM

## 2019-03-25 NOTE — Progress Notes (Signed)
MOB was referred for history of depression/anxiety. * Referral screened out by Clinical Social Worker because none of the following criteria appear to apply: ~ History of anxiety/depression during this pregnancy, or of post-partum depression following prior delivery. ~ Diagnosis of anxiety and/or depression within last 3 years. Per MOB's chart review, MOB diagnosed with anxiety in August 2014.  OR * MOB's symptoms currently being treated with medication and/or therapy.    Please contact the Clinical Social Worker if needs arise, by MOB request, or if MOB scores greater than 9/yes to question 10 on [place] Postpartum Depression Screen.      Charnee Turnipseed S. Seirra Kos, MSW, LCSW-A [facility] [place] [phone]  

## 2019-03-25 NOTE — Progress Notes (Signed)
Post Partum Day 1 Subjective: no complaints and up ad lib  Objective: Blood pressure 100/70, pulse (!) 44, temperature 98.3 F (36.8 C), temperature source Oral, resp. rate 16, SpO2 100 %, unknown if currently breastfeeding.  Physical Exam:  General: alert and cooperative Lochia: appropriate Uterine Fundus: firm   Recent Labs    03/24/19 2350 03/25/19 0711  HGB 12.3 11.7*  HCT 35.7* 34.4*    Assessment/Plan: Plan for discharge tomorrow  Pt desires circumcision in hospital, baby not ready this AM so Dr. Willis Modena planning to come do tomorrow AM and d/c patient as well.    LOS: 1 day   Logan Bores 03/25/2019, 9:25 AM

## 2019-03-26 MED ORDER — IBUPROFEN 600 MG PO TABS: 600.0000 mg | ORAL_TABLET | Freq: Four times a day (QID) | ORAL | 0 refills | Status: AC

## 2019-03-26 NOTE — Lactation Note (Addendum)
This note was copied from a baby's chart. Lactation Consultation Note  Patient Name: [name] [name] [id number] Date: 03/26/2019 Reason for consult: Follow-up assessment;Infant weight loss;Early term 37-38.6wks(5 % weight loss / post circ) Baby is 56 hours old  Bili @ 34 hours - 8.2  LC reviewed and updated the doc flow sheets per mom  Baby sound asleep after circ. And last fed at 0700.  Mom denies soreness and feels breast feeding is going well.  Sore nipple and engorgement prevention and tx reviewed.  Per mom has DEBP Spectra 2 at home.  Mom aware of the [facility] resources after D/C.    Maternal Data Has patient been taught Hand Expression?: Yes  Feeding Feeding Type: (per mom baby last fed at 7 am) Nipple Type: Slow - flow  LATCH Score                   Interventions Interventions: Breast feeding basics reviewed  Lactation Tools Discussed/Used WIC Program: No Pump Review: Milk Storage   Consult Status Consult Status: Complete Date: 03/26/19    Myer Haff 03/26/2019, 9:26 AM

## 2019-03-26 NOTE — Discharge Summary (Signed)
OB Discharge Summary     Patient Name: [name] [name] DOB: 1981-06-30 MRN: [id number]  Date of admission: 03/24/2019 Delivering MD: Lavina HammanMEISINGER, Jenavi Beedle   Date of discharge: 03/26/2019  Admitting diagnosis: pregnancy Intrauterine pregnancy: [id number]     Secondary diagnosis:  Active Problems:   SVD (spontaneous vaginal delivery)   Indication for care in labor or delivery      Discharge diagnosis: Term Pregnancy Delivered                                                                                                Hospital course:  Onset of Labor With Vaginal Delivery     38 y.o. yo G3P3003 at [redacted]w[redacted]d was admitted in Active Labor on 03/24/2019. Patient had an uncomplicated labor course as follows:  Membrane Rupture Time/Date: 7:32 PM ,03/24/2019   Intrapartum Procedures: Episiotomy: None [1]                                         Lacerations:  None [1]  Patient had a delivery of a Viable infant. 03/24/2019  Information for the patient's newborn:  Rod Maeowell, Boy Secret [[id number]][[id number]]  Delivery Method: Vaginal, Spontaneous(Filed from Delivery Summary)     Pateint had an uncomplicated postpartum course.  She is ambulating, tolerating a regular diet, passing flatus, and urinating well. Patient is discharged home in stable condition on 03/26/19.   Physical exam  Vitals:   03/25/19 0915 03/25/19 1539 03/26/19 0011 03/26/19 0530  BP: 98/72 101/68 98/72 (!) 87/64  Pulse: (!) 51 (!) 46 (!) 52 (!) 44  Resp: 18 16 16 16   Temp: 97.9 F (36.6 C) 98.4 F (36.9 C) 97.6 F (36.4 C) (!) 97.5 F (36.4 C)  TempSrc: Oral Oral Oral Oral  SpO2:    100%   General: alert Lochia: appropriate Uterine Fundus: firm  Labs: Lab Results  Component Value Date   WBC 8.2 03/25/2019   HGB 11.7 (L) 03/25/2019   HCT 34.4 (L) 03/25/2019   MCV 98.0 03/25/2019   PLT 189 03/25/2019   CMP Latest Ref Rng & Units 07/22/2016  Glucose 65 - 99 mg/dL 92  BUN 6 - 20 mg/dL <7(W<5(L)  Creatinine 2.950.44 - 1.00 mg/dL 6.210.48  Sodium  308135 - 657145 mmol/L 137  Potassium 3.5 - 5.1 mmol/L 3.5  Chloride 101 - 111 mmol/L 104  CO2 22 - 32 mmol/L 26  Calcium 8.9 - 10.3 mg/dL 8.4(O8.4(L)  Total Protein 6.5 - 8.1 g/dL -  Total Bilirubin 0.3 - 1.2 mg/dL -  Alkaline Phos 38 - 962126 U/L -  AST 15 - 41 U/L -  ALT 14 - 54 U/L -    Discharge instruction: per After Visit Summary and "Baby and Me Booklet".  After visit meds:  Allergies as of 03/26/2019      Reactions   No Known Allergies       Medication List    TAKE these medications   aspirin 81 MG chewable tablet Chew 81 mg by mouth  daily.   ibuprofen 600 MG tablet Commonly known as: ADVIL Take 1 tablet (600 mg total) by mouth every 6 (six) hours.   prenatal multivitamin Tabs tablet Take 1 tablet by mouth daily at 12 noon.   senna-docusate 8.6-50 MG tablet Commonly known as: Senokot S Take 1 tablet by mouth at bedtime as needed.       Diet: routine diet  Activity: Advance as tolerated. Pelvic rest for 6 weeks.   Outpatient follow up:6 weeks  Newborn Data: Live born female  Birth Weight: 6 lb 12.6 oz (3079 g) APGAR: 9, 9  Newborn Delivery   Birth date/time: 03/24/2019 19:34:00 Delivery type: Vaginal, Spontaneous      Baby Feeding: Breast Disposition:home with mother   03/26/2019 Clarene Duke, MD

## 2019-03-26 NOTE — Progress Notes (Signed)
PPD #2 No problems Afeb, VSS Fundus firm D/c home 

## 2019-03-26 NOTE — Discharge Instructions (Signed)
As per discharge pamphlet °

## 2021-03-08 ENCOUNTER — Telehealth: Payer: Self-pay | Admitting: Orthopaedic Surgery

## 2021-03-08 NOTE — Telephone Encounter (Signed)
Called patient. She is coming in for an appt thursday

## 2021-03-08 NOTE — Telephone Encounter (Signed)
Pt called about something that happened (she didn't want to tell me).  [phone]

## 2021-03-14 ENCOUNTER — Ambulatory Visit: Payer: PRIVATE HEALTH INSURANCE | Admitting: Orthopaedic Surgery

## 2021-03-14 ENCOUNTER — Ambulatory Visit: Payer: Self-pay

## 2021-03-14 ENCOUNTER — Encounter: Payer: Self-pay | Admitting: Orthopaedic Surgery

## 2021-03-14 ENCOUNTER — Other Ambulatory Visit: Payer: Self-pay

## 2021-03-14 VITALS — Ht 67.0 in | Wt 125.0 lb

## 2021-03-14 DIAGNOSIS — M898X1 Other specified disorders of bone, shoulder: Secondary | ICD-10-CM
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
